# Patient Record
Sex: Female | Born: 1935 | Race: Black or African American | Hispanic: No | Smoking: Never smoker
Health system: Southern US, Community
[De-identification: ages and names within clinical notes are randomized; demographics above are authoritative.]

## PROBLEM LIST (undated history)

## (undated) DIAGNOSIS — I10 Essential (primary) hypertension: Secondary | ICD-10-CM

## (undated) DIAGNOSIS — E119 Type 2 diabetes mellitus without complications: Secondary | ICD-10-CM

## (undated) DIAGNOSIS — E785 Hyperlipidemia, unspecified: Secondary | ICD-10-CM

## (undated) DIAGNOSIS — E039 Hypothyroidism, unspecified: Secondary | ICD-10-CM

---

## 2004-05-04 ENCOUNTER — Ambulatory Visit: Payer: Self-pay | Admitting: *Deleted

## 2006-06-08 ENCOUNTER — Ambulatory Visit: Payer: Self-pay | Admitting: Nurse Practitioner

## 2007-08-03 ENCOUNTER — Ambulatory Visit: Payer: Self-pay | Admitting: Family Medicine

## 2008-08-08 ENCOUNTER — Ambulatory Visit: Payer: Self-pay | Admitting: Family Medicine

## 2009-08-27 ENCOUNTER — Ambulatory Visit: Payer: Self-pay | Admitting: Family Medicine

## 2009-09-15 ENCOUNTER — Ambulatory Visit: Payer: Self-pay | Admitting: Family Medicine

## 2010-09-17 ENCOUNTER — Ambulatory Visit: Payer: Self-pay | Admitting: Family Medicine

## 2011-03-28 ENCOUNTER — Emergency Department: Payer: Self-pay | Admitting: Emergency Medicine

## 2011-12-29 ENCOUNTER — Ambulatory Visit: Payer: Self-pay | Admitting: Family Medicine

## 2013-03-27 ENCOUNTER — Ambulatory Visit: Payer: Self-pay | Admitting: Family Medicine

## 2013-05-03 ENCOUNTER — Ambulatory Visit: Payer: Self-pay | Admitting: Family Medicine

## 2013-05-21 ENCOUNTER — Ambulatory Visit: Payer: Self-pay | Admitting: Orthopedic Surgery

## 2013-06-14 ENCOUNTER — Ambulatory Visit: Payer: Self-pay | Admitting: Pain Medicine

## 2013-06-25 ENCOUNTER — Ambulatory Visit: Payer: Self-pay | Admitting: Pain Medicine

## 2013-07-05 ENCOUNTER — Ambulatory Visit: Payer: Self-pay | Admitting: Pain Medicine

## 2013-07-19 ENCOUNTER — Ambulatory Visit: Payer: Self-pay | Admitting: Pain Medicine

## 2013-08-06 ENCOUNTER — Ambulatory Visit: Payer: Self-pay | Admitting: Pain Medicine

## 2013-09-05 ENCOUNTER — Ambulatory Visit: Payer: Self-pay | Admitting: Pain Medicine

## 2013-09-11 ENCOUNTER — Ambulatory Visit: Payer: Self-pay | Admitting: Pain Medicine

## 2013-11-02 ENCOUNTER — Ambulatory Visit: Payer: Self-pay | Admitting: Pain Medicine

## 2014-03-06 ENCOUNTER — Ambulatory Visit: Payer: Self-pay | Admitting: Family Medicine

## 2014-06-10 ENCOUNTER — Ambulatory Visit: Payer: Self-pay | Admitting: Family Medicine

## 2016-06-17 ENCOUNTER — Other Ambulatory Visit: Payer: Self-pay | Admitting: Family Medicine

## 2016-06-17 DIAGNOSIS — Z1231 Encounter for screening mammogram for malignant neoplasm of breast: Secondary | ICD-10-CM

## 2016-07-20 ENCOUNTER — Ambulatory Visit
Admission: RE | Admit: 2016-07-20 | Discharge: 2016-07-20 | Disposition: A | Discharge status: Home / Self Care | Payer: Medicare Other | Source: Ambulatory Visit | Attending: Family Medicine | Admitting: Family Medicine

## 2016-07-20 ENCOUNTER — Encounter: Payer: Self-pay | Admitting: Radiology

## 2016-07-20 DIAGNOSIS — Z1231 Encounter for screening mammogram for malignant neoplasm of breast: Secondary | ICD-10-CM | POA: Insufficient documentation

## 2017-04-29 ENCOUNTER — Emergency Department
Admission: EM | Admit: 2017-04-29 | Discharge: 2017-04-29 | Disposition: A | Discharge status: Home / Self Care | Payer: Medicare Other | Attending: Emergency Medicine | Admitting: Emergency Medicine

## 2017-04-29 ENCOUNTER — Emergency Department: Payer: Medicare Other

## 2017-04-29 DIAGNOSIS — J101 Influenza due to other identified influenza virus with other respiratory manifestations: Secondary | ICD-10-CM | POA: Diagnosis not present

## 2017-04-29 DIAGNOSIS — Z7984 Long term (current) use of oral hypoglycemic drugs: Secondary | ICD-10-CM | POA: Diagnosis not present

## 2017-04-29 DIAGNOSIS — R531 Weakness: Secondary | ICD-10-CM

## 2017-04-29 DIAGNOSIS — E876 Hypokalemia: Secondary | ICD-10-CM | POA: Insufficient documentation

## 2017-04-29 DIAGNOSIS — Z79899 Other long term (current) drug therapy: Secondary | ICD-10-CM | POA: Diagnosis not present

## 2017-04-29 LAB — BASIC METABOLIC PANEL
Anion gap: 13 (ref 5–15)
BUN: 28 mg/dL — AB (ref 6–20)
CHLORIDE: 102 mmol/L (ref 101–111)
CO2: 23 mmol/L (ref 22–32)
CREATININE: 1.55 mg/dL — AB (ref 0.44–1.00)
Calcium: 8.8 mg/dL — ABNORMAL LOW (ref 8.9–10.3)
GFR calc Af Amer: 35 mL/min — ABNORMAL LOW (ref >60)
GFR calc non Af Amer: 30 mL/min — ABNORMAL LOW (ref >60)
GLUCOSE: 167 mg/dL — AB (ref 65–99)
POTASSIUM: 3 mmol/L — AB (ref 3.5–5.1)
SODIUM: 138 mmol/L (ref 135–145)

## 2017-04-29 LAB — URINALYSIS, COMPLETE (UACMP) WITH MICROSCOPIC
BACTERIA UA: NONE SEEN
BILIRUBIN URINE: NEGATIVE
Glucose, UA: NEGATIVE mg/dL
KETONES UR: NEGATIVE mg/dL
Leukocytes, UA: NEGATIVE
Nitrite: NEGATIVE
PH: 5 (ref 5.0–8.0)
PROTEIN: NEGATIVE mg/dL
Specific Gravity, Urine: 1.011 (ref 1.005–1.030)

## 2017-04-29 LAB — CBC WITH DIFFERENTIAL/PLATELET
Basophils Absolute: 0 10*3/uL (ref 0–0.1)
Basophils Relative: 1 %
EOS ABS: 0.1 10*3/uL (ref 0–0.7)
Eosinophils Relative: 2 %
HEMATOCRIT: 40.4 % (ref 35.0–47.0)
Hemoglobin: 13.3 g/dL (ref 12.0–16.0)
LYMPHS ABS: 1.4 10*3/uL (ref 1.0–3.6)
LYMPHS PCT: 32 %
MCH: 29.7 pg (ref 26.0–34.0)
MCHC: 33 g/dL (ref 32.0–36.0)
MCV: 90.1 fL (ref 80.0–100.0)
MONOS PCT: 10 %
Monocytes Absolute: 0.4 10*3/uL (ref 0.2–0.9)
NEUTROS PCT: 55 %
Neutro Abs: 2.4 10*3/uL (ref 1.4–6.5)
Platelets: 197 10*3/uL (ref 150–440)
RBC: 4.49 MIL/uL (ref 3.80–5.20)
RDW: 13.9 % (ref 11.5–14.5)
WBC: 4.4 10*3/uL (ref 3.6–11.0)

## 2017-04-29 LAB — INFLUENZA PANEL BY PCR (TYPE A & B)
INFLBPCR: NEGATIVE
Influenza A By PCR: POSITIVE — AB

## 2017-04-29 LAB — TROPONIN I: Troponin I: <0.03 ng/mL (ref <0.03)

## 2017-04-29 MED ORDER — SODIUM CHLORIDE 0.9 % IV BOLUS (SEPSIS)
500.0000 mL | Freq: Once | INTRAVENOUS | Status: AC
  Administered 2017-04-29: 500 mL via INTRAVENOUS

## 2017-04-29 MED ORDER — POTASSIUM CHLORIDE ER 10 MEQ PO TBCR: 10.0000 meq | EXTENDED_RELEASE_TABLET | Freq: Two times a day (BID) | ORAL | 0 refills | Status: DC

## 2017-04-29 MED ORDER — POTASSIUM CHLORIDE 10 MEQ/100ML IV SOLN
10.0000 meq | Freq: Once | INTRAVENOUS | Status: AC
  Administered 2017-04-29: 10 meq via INTRAVENOUS
  Filled 2017-04-29: qty 100

## 2017-04-29 NOTE — Discharge Instructions (Addendum)
Your tests reveal the flu.  You also have a slightly low potassium.  You should try to get rest, stay well-hydrated, and eat regularly. You should follow-up with your doctor within 1-2 weeks and have your blood work rechecked.  Return to the emergency department immediately for new or worsening weakness, shortness of breath, high fevers, vomiting, or any other new or worsening symptoms that concern you.

## 2017-04-29 NOTE — ED Notes (Addendum)
This Rn called Lab to check on troponin results. Lamonte in Lab said it was still pending. RN will continue to monitor.

## 2017-04-29 NOTE — ED Notes (Signed)
Urine and Flu sent at this time.

## 2017-04-29 NOTE — ED Triage Notes (Signed)
Pt arrives via [facility] for weakness. Pt has had a cold for the last 2 wks and has generalized weakness now. Pt BP in transit was 80/69 500 mL given now BP 102/70. Pt is A/Ox4 NAD. Respiration are even unlabored and skin is warm and dry.Awaiting EDP

## 2017-04-29 NOTE — ED Notes (Signed)
Called lab, per lab they did not receive specimen so it has not been rubbed. Per lab they will process now

## 2017-04-29 NOTE — ED Notes (Signed)
Patient transported to CT 

## 2017-04-29 NOTE — ED Provider Notes (Addendum)
[facility] Emergency Department Provider Note ____________________________________________   First MD Initiated Contact with Patient 04/29/17 1223     (approximate)  I have reviewed the triage vital signs and the nursing notes.   HISTORY  Chief Complaint Weakness    HPI Allison Rogers is a 83 y.o. female with past medical history as noted below who presents with generalized weakness over the last several days, gradual onset, persistent course, and preceded by several days of nonproductive cough and nasal congestion and rhinorrhea which began about 1 week ago.  The patient denies chest pain, difficulty breathing, urinary symptoms, vomiting, or diarrhea.  There are no active problems to display for this patient.   No past surgical history on file.  Prior to Admission medications   Medication Sig Start Date End Date Taking? Authorizing Provider  alendronate (FOSAMAX) 70 MG tablet Take 70 mg by mouth once a week. 04/18/17   [provider]  amLODipine (NORVASC) 10 MG tablet Take 10 mg by mouth daily. 03/15/17   [provider]  atorvastatin (LIPITOR) 40 MG tablet Take 40 mg by mouth daily. 03/15/17   [provider]  brimonidine (ALPHAGAN) 0.2 % ophthalmic solution Place 1 drop into both eyes 2 (two) times daily. 03/15/17   [provider]  carvedilol (COREG) 6.25 MG tablet Take 6.25 mg by mouth 2 (two) times daily. 04/19/17   [provider]  cloNIDine (CATAPRES) 0.1 MG tablet Take 0.1 mg by mouth 2 (two) times daily. 04/18/17   [provider]  furosemide (LASIX) 20 MG tablet Take 20 mg by mouth daily. 04/19/17   [provider]  gabapentin (NEURONTIN) 100 MG capsule Take 100 mg by mouth 3 (three) times daily. 03/15/17   [provider]  GLIPIZIDE XL 10 MG 24 hr tablet Take 10 mg by mouth 2 (two) times daily. 03/24/17   [provider]  IBU 800 MG tablet Take 800 mg by mouth 3 (three)  times daily. 04/22/17   [provider]  lisinopril (PRINIVIL,ZESTRIL) 40 MG tablet Take 40 mg by mouth daily. 02/10/17   [provider]  meloxicam (MOBIC) 15 MG tablet Take 7.5 mg by mouth daily. 04/18/17   [provider]  metFORMIN (GLUCOPHAGE) 500 MG tablet Take 1 tablet by mouth 2 (two) times daily. 03/24/17   [provider]  omeprazole (PRILOSEC) 20 MG capsule Take 20 mg by mouth daily. 03/15/17   [provider]  potassium chloride (K-DUR) 10 MEQ tablet Take 1 tablet (10 mEq total) by mouth 2 (two) times daily for 7 days. 04/29/17 05/06/17  Dionne Bucy, MD  tiZANidine (ZANAFLEX) 4 MG tablet Take 4 mg by mouth 2 (two) times daily as needed. 04/18/17   [provider]    Allergies Patient has no allergy information on record.  Family History  Problem Relation Age of Onset  . Breast cancer Neg Hx     Social History Social History   Tobacco Use  . Smoking status: Not on file  Substance Use Topics  . Alcohol use: Not on file  . Drug use: Not on file    Review of Systems  Constitutional: Positive for chills and generalized weakness. Eyes: No redness. ENT: No sore throat. Cardiovascular: Denies chest pain. Respiratory: Denies shortness of breath. Gastrointestinal: No nausea, no vomiting.  No diarrhea.  Genitourinary: Negative for dysuria.  Musculoskeletal: Negative for back pain. Skin: Negative for rash. Neurological: Negative for headache.   ____________________________________________   PHYSICAL EXAM:  VITAL SIGNS: ED Triage Vitals  Enc Vitals Group     BP 04/29/17 1201 119/61     Pulse --      Resp 04/29/17 1201 16     Temp 04/29/17 1201 98.2 F (36.8 C)     Temp Source 04/29/17 1201 Oral     SpO2 04/29/17 1200 98 %     Weight 04/29/17 1201 200 lb (90.7 kg)     Height 04/29/17 1201 5\' 7"  (1.702 m)     Head Circumference --      Peak Flow --      Pain Score --      Pain Loc --      Pain Edu? --       Excl. in GC? --     Constitutional: Alert and oriented. Relatively well appearing and in no acute distress. Eyes: Conjunctivae are normal.  Head: Atraumatic. Nose: No congestion/rhinnorhea. Mouth/Throat: Mucous membranes are slightly dry.   Neck: Normal range of motion.  Cardiovascular: Normal rate, regular rhythm. Grossly normal heart sounds.  Good peripheral circulation. Respiratory: Normal respiratory effort.  No retractions. Lungs CTAB. Gastrointestinal: Soft and nontender. No distention.  Genitourinary: No CVA tenderness. Musculoskeletal: No lower extremity edema.  Extremities warm and well perfused.  Neurologic:  Normal speech and language. No gross focal neurologic deficits are appreciated.  Skin:  Skin is warm and dry. No rash noted. Psychiatric: Mood and affect are normal. Speech and behavior are normal.  ____________________________________________   LABS (all labs ordered are listed, but only abnormal results are displayed)  Labs Reviewed  BASIC METABOLIC PANEL - Abnormal; Notable for the following components:      Result Value   Potassium 3.0 (*)    Glucose, Bld 167 (*)    BUN 28 (*)    Creatinine, Ser 1.55 (*)    Calcium 8.8 (*)    GFR calc non Af Amer 30 (*)    GFR calc Af Amer 35 (*)    All other components within normal limits  URINALYSIS, COMPLETE (UACMP) WITH MICROSCOPIC - Abnormal; Notable for the following components:   Color, Urine YELLOW (*)    APPearance HAZY (*)    Hgb urine dipstick SMALL (*)    Squamous Epithelial / LPF 0-5 (*)    All other components within normal limits  INFLUENZA PANEL BY PCR (TYPE A & B) - Abnormal; Notable for the following components:   Influenza A By PCR POSITIVE (*)    All other components within normal limits  CBC WITH DIFFERENTIAL/PLATELET  TROPONIN I  TROPONIN I   ____________________________________________  EKG  ED ECG REPORT I, Dionne Bucy, the attending physician, personally viewed and interpreted  this ECG.  Date: 04/29/2017 EKG Time: 1204 Rate: 49 Rhythm: normal sinus rhythm QRS Axis: normal Intervals: normal ST/T Wave abnormalities: LVH, nonspecific abnormalities inferiorly Narrative Interpretation: no evidence of acute ischemia; no recent prior EKG available for comparison  ____________________________________________  RADIOLOGY  CXR: Probable pulmonary hypertension but no focal infiltrate  ____________________________________________   PROCEDURES  Procedure(s) performed: No    Critical Care performed: No ____________________________________________   INITIAL IMPRESSION / ASSESSMENT AND PLAN / ED COURSE  Pertinent labs & imaging results that were available during my care of the patient were reviewed by me and considered in my medical decision making (see chart for details).   83 year old female with past medical history as noted above presents with generalized weakness over the last several days, preceded by at least 1 week of  cough, nasal congestion and URI symptoms.  Past medical records reviewed in Epic and are noncontributory.  On exam, the patient is relatively well-appearing, the vital signs are normal, and the remainder the exam is unremarkable except for slightly dry mucous membranes.  Differential includes viral syndrome, influenza, pneumonia, UTI, or less likely other source of infection, dehydration or other metabolic cause, or less likely cardiac etiology.  Plan for infection workup, IV fluid bolus, basic labs, troponin x1, and reassess.  ----------------------------------------- 3:24 PM on 04/29/2017 -----------------------------------------  Patient's workup reveals influenza A as well as mild hypokalemia.  Since patient's symptoms have lasted for at least 1 week she has not a candidate for Tamiflu.  The remainder of the workup is unremarkable so anticipate that the patient will likely be able to be discharged.  However there has been a delay  with the troponin which was sent but there appears to be some type of clerical error with the lab.  We will obtain a troponin result, and replete K in the meantime.  If negative, anticipate discharge home.    ----------------------------------------- 4:03 PM on 04/29/2017 -----------------------------------------  Troponin is resulted and is negative.  The patient feels well to go home.  I counseled her on the results of the workup.  Patient states she is already on potassium repletion so I will not give a prescription at this time.  I instructed her to have her doctor check it in 1-2 weeks.  I gave thorough return precautions, and the patient expressed understanding.  ____________________________________________   FINAL CLINICAL IMPRESSION(S) / ED DIAGNOSES  Final diagnoses:  Influenza A  Weakness  Hypokalemia      NEW MEDICATIONS STARTED DURING THIS VISIT:  New Prescriptions   POTASSIUM CHLORIDE (K-DUR) 10 MEQ TABLET    Take 1 tablet (10 mEq total) by mouth 2 (two) times daily for 7 days.     Note:  This document was prepared using Dragon voice recognition software and may include unintentional dictation errors.   Dionne Bucy, MD 04/29/17 1526  Dionne Bucy, MD 04/29/17 [phone]

## 2017-07-13 ENCOUNTER — Other Ambulatory Visit: Payer: Self-pay | Admitting: Family Medicine

## 2017-07-13 DIAGNOSIS — Z1231 Encounter for screening mammogram for malignant neoplasm of breast: Secondary | ICD-10-CM

## 2017-08-16 ENCOUNTER — Ambulatory Visit
Admission: RE | Admit: 2017-08-16 | Discharge: 2017-08-16 | Disposition: A | Discharge status: Home / Self Care | Payer: Medicare Other | Source: Ambulatory Visit | Attending: Family Medicine | Admitting: Family Medicine

## 2017-08-16 DIAGNOSIS — Z1231 Encounter for screening mammogram for malignant neoplasm of breast: Secondary | ICD-10-CM | POA: Diagnosis not present

## 2017-08-16 DIAGNOSIS — R928 Other abnormal and inconclusive findings on diagnostic imaging of breast: Secondary | ICD-10-CM | POA: Diagnosis not present

## 2017-08-18 ENCOUNTER — Other Ambulatory Visit: Payer: Self-pay | Admitting: Family Medicine

## 2017-08-18 DIAGNOSIS — N6489 Other specified disorders of breast: Secondary | ICD-10-CM

## 2017-08-18 DIAGNOSIS — R928 Other abnormal and inconclusive findings on diagnostic imaging of breast: Secondary | ICD-10-CM

## 2017-09-06 ENCOUNTER — Ambulatory Visit
Admission: RE | Admit: 2017-09-06 | Discharge: 2017-09-06 | Disposition: A | Discharge status: Home / Self Care | Payer: Medicare Other | Source: Ambulatory Visit | Attending: Family Medicine | Admitting: Family Medicine

## 2017-09-06 DIAGNOSIS — N6489 Other specified disorders of breast: Secondary | ICD-10-CM

## 2017-09-06 DIAGNOSIS — R928 Other abnormal and inconclusive findings on diagnostic imaging of breast: Secondary | ICD-10-CM

## 2018-08-24 ENCOUNTER — Other Ambulatory Visit: Payer: Self-pay

## 2018-08-24 ENCOUNTER — Other Ambulatory Visit: Payer: Self-pay | Admitting: Family Medicine

## 2018-08-24 ENCOUNTER — Ambulatory Visit
Admission: RE | Admit: 2018-08-24 | Discharge: 2018-08-24 | Disposition: A | Discharge status: Home / Self Care | Payer: Medicare Other | Attending: Family Medicine | Admitting: Family Medicine

## 2018-08-24 ENCOUNTER — Ambulatory Visit
Admission: RE | Admit: 2018-08-24 | Discharge: 2018-08-24 | Disposition: A | Discharge status: Home / Self Care | Payer: Medicare Other | Source: Ambulatory Visit | Attending: Family Medicine | Admitting: Family Medicine

## 2018-08-24 DIAGNOSIS — M25862 Other specified joint disorders, left knee: Secondary | ICD-10-CM | POA: Diagnosis present

## 2018-08-30 ENCOUNTER — Other Ambulatory Visit: Payer: Self-pay | Admitting: Family Medicine

## 2018-08-30 DIAGNOSIS — M25862 Other specified joint disorders, left knee: Secondary | ICD-10-CM

## 2018-11-14 DIAGNOSIS — H401133 Primary open-angle glaucoma, bilateral, severe stage: Secondary | ICD-10-CM | POA: Insufficient documentation

## 2018-11-14 DIAGNOSIS — Z961 Presence of intraocular lens: Secondary | ICD-10-CM | POA: Insufficient documentation

## 2018-11-28 ENCOUNTER — Other Ambulatory Visit: Payer: Self-pay | Admitting: Neurology

## 2018-11-28 DIAGNOSIS — R2689 Other abnormalities of gait and mobility: Secondary | ICD-10-CM

## 2018-12-03 ENCOUNTER — Other Ambulatory Visit: Payer: Self-pay

## 2018-12-03 ENCOUNTER — Ambulatory Visit
Admission: RE | Admit: 2018-12-03 | Discharge: 2018-12-03 | Disposition: A | Discharge status: Home / Self Care | Payer: Medicare Other | Source: Ambulatory Visit | Attending: Family Medicine | Admitting: Family Medicine

## 2018-12-03 ENCOUNTER — Ambulatory Visit
Admission: RE | Admit: 2018-12-03 | Discharge: 2018-12-03 | Disposition: A | Discharge status: Home / Self Care | Payer: Medicare Other | Source: Ambulatory Visit | Attending: Neurology | Admitting: Neurology

## 2018-12-03 DIAGNOSIS — R2689 Other abnormalities of gait and mobility: Secondary | ICD-10-CM

## 2018-12-03 DIAGNOSIS — M25862 Other specified joint disorders, left knee: Secondary | ICD-10-CM | POA: Diagnosis not present

## 2018-12-03 LAB — POCT I-STAT CREATININE: Creatinine, Ser: 1 mg/dL (ref 0.44–1.00)

## 2018-12-03 MED ORDER — GADOBUTROL 1 MMOL/ML IV SOLN
9.0000 mL | Freq: Once | INTRAVENOUS | Status: AC | PRN
Start: 2018-12-03 — End: 2018-12-03
  Administered 2018-12-03: 9 mL via INTRAVENOUS

## 2018-12-11 ENCOUNTER — Ambulatory Visit: Payer: Medicare Other

## 2019-08-28 ENCOUNTER — Other Ambulatory Visit: Payer: Self-pay | Admitting: Family Medicine

## 2019-08-28 DIAGNOSIS — Z1231 Encounter for screening mammogram for malignant neoplasm of breast: Secondary | ICD-10-CM

## 2019-08-31 ENCOUNTER — Ambulatory Visit
Admission: RE | Admit: 2019-08-31 | Discharge: 2019-08-31 | Disposition: A | Discharge status: Home / Self Care | Payer: Medicare Other | Source: Ambulatory Visit | Attending: Family Medicine | Admitting: Family Medicine

## 2019-08-31 DIAGNOSIS — Z1231 Encounter for screening mammogram for malignant neoplasm of breast: Secondary | ICD-10-CM

## 2020-03-31 ENCOUNTER — Other Ambulatory Visit: Payer: Self-pay | Admitting: Family Medicine

## 2020-03-31 DIAGNOSIS — Z13820 Encounter for screening for osteoporosis: Secondary | ICD-10-CM

## 2020-03-31 DIAGNOSIS — Z1231 Encounter for screening mammogram for malignant neoplasm of breast: Secondary | ICD-10-CM

## 2020-09-01 ENCOUNTER — Emergency Department: Payer: Medicare Other

## 2020-09-01 ENCOUNTER — Inpatient Hospital Stay
Admission: EM | Admit: 2020-09-01 | Discharge: 2020-09-04 | DRG: 640 | Disposition: A | Discharge status: Home Health | Payer: Medicare Other | Attending: Internal Medicine | Admitting: Internal Medicine

## 2020-09-01 DIAGNOSIS — I10 Essential (primary) hypertension: Secondary | ICD-10-CM | POA: Diagnosis present

## 2020-09-01 DIAGNOSIS — Z79899 Other long term (current) drug therapy: Secondary | ICD-10-CM | POA: Diagnosis not present

## 2020-09-01 DIAGNOSIS — R5381 Other malaise: Secondary | ICD-10-CM

## 2020-09-01 DIAGNOSIS — R627 Adult failure to thrive: Secondary | ICD-10-CM | POA: Diagnosis present

## 2020-09-01 DIAGNOSIS — E785 Hyperlipidemia, unspecified: Secondary | ICD-10-CM | POA: Diagnosis present

## 2020-09-01 DIAGNOSIS — E8809 Other disorders of plasma-protein metabolism, not elsewhere classified: Secondary | ICD-10-CM | POA: Diagnosis present

## 2020-09-01 DIAGNOSIS — I440 Atrioventricular block, first degree: Secondary | ICD-10-CM | POA: Diagnosis present

## 2020-09-01 DIAGNOSIS — I160 Hypertensive urgency: Secondary | ICD-10-CM | POA: Diagnosis not present

## 2020-09-01 DIAGNOSIS — Z7984 Long term (current) use of oral hypoglycemic drugs: Secondary | ICD-10-CM

## 2020-09-01 DIAGNOSIS — I959 Hypotension, unspecified: Secondary | ICD-10-CM | POA: Diagnosis not present

## 2020-09-01 DIAGNOSIS — R531 Weakness: Secondary | ICD-10-CM | POA: Diagnosis not present

## 2020-09-01 DIAGNOSIS — Z79890 Hormone replacement therapy (postmenopausal): Secondary | ICD-10-CM

## 2020-09-01 DIAGNOSIS — R571 Hypovolemic shock: Secondary | ICD-10-CM

## 2020-09-01 DIAGNOSIS — E876 Hypokalemia: Secondary | ICD-10-CM | POA: Diagnosis not present

## 2020-09-01 DIAGNOSIS — E039 Hypothyroidism, unspecified: Secondary | ICD-10-CM

## 2020-09-01 DIAGNOSIS — I428 Other cardiomyopathies: Secondary | ICD-10-CM | POA: Diagnosis not present

## 2020-09-01 DIAGNOSIS — Z7189 Other specified counseling: Secondary | ICD-10-CM | POA: Diagnosis not present

## 2020-09-01 DIAGNOSIS — R55 Syncope and collapse: Secondary | ICD-10-CM | POA: Diagnosis present

## 2020-09-01 DIAGNOSIS — F32A Depression, unspecified: Secondary | ICD-10-CM | POA: Diagnosis present

## 2020-09-01 DIAGNOSIS — Z20822 Contact with and (suspected) exposure to covid-19: Secondary | ICD-10-CM | POA: Diagnosis present

## 2020-09-01 DIAGNOSIS — N179 Acute kidney failure, unspecified: Secondary | ICD-10-CM | POA: Diagnosis present

## 2020-09-01 DIAGNOSIS — E119 Type 2 diabetes mellitus without complications: Secondary | ICD-10-CM | POA: Diagnosis present

## 2020-09-01 DIAGNOSIS — E872 Acidosis: Secondary | ICD-10-CM | POA: Diagnosis present

## 2020-09-01 DIAGNOSIS — R001 Bradycardia, unspecified: Secondary | ICD-10-CM

## 2020-09-01 DIAGNOSIS — Z7983 Long term (current) use of bisphosphonates: Secondary | ICD-10-CM | POA: Diagnosis not present

## 2020-09-01 DIAGNOSIS — Z880 Allergy status to penicillin: Secondary | ICD-10-CM | POA: Diagnosis not present

## 2020-09-01 DIAGNOSIS — Z7982 Long term (current) use of aspirin: Secondary | ICD-10-CM

## 2020-09-01 DIAGNOSIS — Z6828 Body mass index (BMI) 28.0-28.9, adult: Secondary | ICD-10-CM

## 2020-09-01 DIAGNOSIS — Z791 Long term (current) use of non-steroidal anti-inflammatories (NSAID): Secondary | ICD-10-CM | POA: Diagnosis not present

## 2020-09-01 DIAGNOSIS — E860 Dehydration: Secondary | ICD-10-CM | POA: Diagnosis not present

## 2020-09-01 DIAGNOSIS — Z515 Encounter for palliative care: Secondary | ICD-10-CM | POA: Diagnosis not present

## 2020-09-01 HISTORY — DX: Hyperlipidemia, unspecified: E78.5

## 2020-09-01 HISTORY — DX: Essential (primary) hypertension: I10

## 2020-09-01 HISTORY — DX: Hypothyroidism, unspecified: E03.9

## 2020-09-01 HISTORY — DX: Type 2 diabetes mellitus without complications: E11.9

## 2020-09-01 LAB — CBC WITH DIFFERENTIAL/PLATELET
Abs Immature Granulocytes: 0.08 10*3/uL — ABNORMAL HIGH (ref 0.00–0.07)
Basophils Absolute: 0.1 10*3/uL (ref 0.0–0.1)
Basophils Relative: 1 %
Eosinophils Absolute: 0.1 10*3/uL (ref 0.0–0.5)
Eosinophils Relative: 1 %
HCT: 32.1 % — ABNORMAL LOW (ref 36.0–46.0)
Hemoglobin: 10 g/dL — ABNORMAL LOW (ref 12.0–15.0)
Immature Granulocytes: 1 %
Lymphocytes Relative: 17 %
Lymphs Abs: 1.3 10*3/uL (ref 0.7–4.0)
MCH: 28.3 pg (ref 26.0–34.0)
MCHC: 31.2 g/dL (ref 30.0–36.0)
MCV: 90.9 fL (ref 80.0–100.0)
Monocytes Absolute: 0.5 10*3/uL (ref 0.1–1.0)
Monocytes Relative: 7 %
Neutro Abs: 5.6 10*3/uL (ref 1.7–7.7)
Neutrophils Relative %: 73 %
Platelets: 375 10*3/uL (ref 150–400)
RBC: 3.53 MIL/uL — ABNORMAL LOW (ref 3.87–5.11)
RDW: 13.4 % (ref 11.5–15.5)
WBC: 7.6 10*3/uL (ref 4.0–10.5)
nRBC: 0 % (ref 0.0–0.2)

## 2020-09-01 LAB — COMPREHENSIVE METABOLIC PANEL
ALT: 25 U/L (ref 0–44)
AST: 31 U/L (ref 15–41)
Albumin: 3 g/dL — ABNORMAL LOW (ref 3.5–5.0)
Alkaline Phosphatase: 58 U/L (ref 38–126)
Anion gap: 13 (ref 5–15)
BUN: 41 mg/dL — ABNORMAL HIGH (ref 8–23)
CO2: 25 mmol/L (ref 22–32)
Calcium: 8.6 mg/dL — ABNORMAL LOW (ref 8.9–10.3)
Chloride: 97 mmol/L — ABNORMAL LOW (ref 98–111)
Creatinine, Ser: 2.04 mg/dL — ABNORMAL HIGH (ref 0.44–1.00)
GFR, Estimated: 24 mL/min — ABNORMAL LOW (ref >60)
Glucose, Bld: 187 mg/dL — ABNORMAL HIGH (ref 70–99)
Potassium: 2.8 mmol/L — ABNORMAL LOW (ref 3.5–5.1)
Sodium: 135 mmol/L (ref 135–145)
Total Bilirubin: 0.7 mg/dL (ref 0.3–1.2)
Total Protein: 7.3 g/dL (ref 6.5–8.1)

## 2020-09-01 LAB — LACTIC ACID, PLASMA
Lactic Acid, Venous: 2.5 mmol/L (ref 0.5–1.9)
Lactic Acid, Venous: 2.5 mmol/L (ref 0.5–1.9)

## 2020-09-01 LAB — RESP PANEL BY RT-PCR (FLU A&B, COVID) ARPGX2
Influenza A by PCR: NEGATIVE
Influenza B by PCR: NEGATIVE
SARS Coronavirus 2 by RT PCR: NEGATIVE

## 2020-09-01 LAB — TROPONIN I (HIGH SENSITIVITY)
Troponin I (High Sensitivity): 19 ng/L — ABNORMAL HIGH (ref <18)
Troponin I (High Sensitivity): 17 ng/L (ref <18)

## 2020-09-01 LAB — MAGNESIUM: Magnesium: 2.4 mg/dL (ref 1.7–2.4)

## 2020-09-01 LAB — PROCALCITONIN: Procalcitonin: <0.1 ng/mL

## 2020-09-01 MED ORDER — HEPARIN SODIUM (PORCINE) 5000 UNIT/ML IJ SOLN
5000.0000 [IU] | Freq: Three times a day (TID) | INTRAMUSCULAR | Status: DC
  Administered 2020-09-02 – 2020-09-04 (×7): 5000 [IU] via SUBCUTANEOUS
  Filled 2020-09-01 (×7): qty 1

## 2020-09-01 MED ORDER — NOREPINEPHRINE 4 MG/250ML-% IV SOLN: 2.0000 ug/min | INTRAVENOUS | Status: DC

## 2020-09-01 MED ORDER — LACTATED RINGERS IV SOLN: INTRAVENOUS | Status: AC

## 2020-09-01 MED ORDER — POTASSIUM CHLORIDE CRYS ER 20 MEQ PO TBCR
40.0000 meq | EXTENDED_RELEASE_TABLET | Freq: Once | ORAL | Status: AC
  Administered 2020-09-01: 40 meq via ORAL
  Filled 2020-09-01: qty 2

## 2020-09-01 MED ORDER — POLYETHYLENE GLYCOL 3350 17 G PO PACK: 17.0000 g | PACK | Freq: Every day | ORAL | Status: DC | PRN

## 2020-09-01 MED ORDER — SODIUM CHLORIDE 0.9 % IV SOLN: 250.0000 mL | INTRAVENOUS | Status: DC

## 2020-09-01 MED ORDER — DOCUSATE SODIUM 100 MG PO CAPS: 100.0000 mg | ORAL_CAPSULE | Freq: Two times a day (BID) | ORAL | Status: DC | PRN

## 2020-09-01 MED ORDER — LACTATED RINGERS IV BOLUS
1000.0000 mL | Freq: Once | INTRAVENOUS | Status: AC
  Administered 2020-09-01: 1000 mL via INTRAVENOUS

## 2020-09-01 MED ORDER — MAGNESIUM SULFATE 2 GM/50ML IV SOLN
2.0000 g | Freq: Once | INTRAVENOUS | Status: AC
  Administered 2020-09-01: 2 g via INTRAVENOUS
  Filled 2020-09-01: qty 50

## 2020-09-01 MED ORDER — ATROPINE SULFATE 1 MG/10ML IJ SOSY
0.5000 mg | PREFILLED_SYRINGE | Freq: Once | INTRAMUSCULAR | Status: AC
  Administered 2020-09-01: 0.5 mg via INTRAVENOUS
  Filled 2020-09-01: qty 10

## 2020-09-01 NOTE — ED Provider Notes (Signed)
[facility] Emergency Department Provider Note   ____________________________________________   Event Date/Time   First MD Initiated Contact with Patient 09/01/20 2029     (approximate)  I have reviewed the triage vital signs and the nursing notes.   HISTORY  Chief Complaint Weakness and Altered Mental Status    HPI Allison Rogers is a 86 y.o. female with past medical history of hypertension, hyperlipidemia, diabetes who presents to the ED for altered mental status.   EMS states that they were called by family for patient being lethargic and having trouble standing.  They noticed that patient seemed to have a fixed gaze off to the right for about 15 seconds, but she did not have any stiffness or generalized seizure activity.  They found her to be bradycardic in the 40s and she was given 0.5 mg of atropine with slight improvement in her heart rate.  Patient responds to questions on arrival, states that she has been feeling weak for a few days and is "just tired."  She denies any fevers, cough, chest pain, shortness of breath, abdominal pain, nausea, vomiting, dysuria, or hematuria.  Daughter at bedside states that patient has been depressed and withdrawn since the death of her daughter about a month and a half ago.  Daughter is concerned that patient has not been taking her medications appropriately and feels like she has been taking less than prescribed.  Patient denies this and denies any recent changes in her medication, does state that she has been taking tizanidine twice a day recently for pain in her knee.        No past medical history on file.  There are no problems to display for this patient.   No past surgical history on file.  Prior to Admission medications   Medication Sig Start Date End Date Taking? Authorizing Provider  alendronate (FOSAMAX) 70 MG tablet Take 70 mg by mouth once a week. 04/18/17   [provider]  amLODipine (NORVASC)  10 MG tablet Take 10 mg by mouth daily. 03/15/17   [provider]  atorvastatin (LIPITOR) 40 MG tablet Take 40 mg by mouth daily. 03/15/17   [provider]  brimonidine (ALPHAGAN) 0.2 % ophthalmic solution Place 1 drop into both eyes 2 (two) times daily. 03/15/17   [provider]  carvedilol (COREG) 6.25 MG tablet Take 6.25 mg by mouth 2 (two) times daily. 04/19/17   [provider]  cloNIDine (CATAPRES) 0.1 MG tablet Take 0.1 mg by mouth 2 (two) times daily. 04/18/17   [provider]  furosemide (LASIX) 20 MG tablet Take 20 mg by mouth daily. 04/19/17   [provider]  gabapentin (NEURONTIN) 100 MG capsule Take 100 mg by mouth 3 (three) times daily. 03/15/17   [provider]  GLIPIZIDE XL 10 MG 24 hr tablet Take 10 mg by mouth 2 (two) times daily. 03/24/17   [provider]  IBU 800 MG tablet Take 800 mg by mouth 3 (three) times daily. 04/22/17   [provider]  lisinopril (PRINIVIL,ZESTRIL) 40 MG tablet Take 40 mg by mouth daily. 02/10/17   [provider]  meloxicam (MOBIC) 15 MG tablet Take 7.5 mg by mouth daily. 04/18/17   [provider]  metFORMIN (GLUCOPHAGE) 500 MG tablet Take 1 tablet by mouth 2 (two) times daily. 03/24/17   [provider]  omeprazole (PRILOSEC) 20 MG capsule Take 20 mg by mouth daily. 03/15/17   [provider]  tiZANidine (  ZANAFLEX) 4 MG tablet Take 4 mg by mouth 2 (two) times daily as needed. 04/18/17   [provider]    Allergies Patient has no allergy information on record.  Family History  Problem Relation Age of Onset  . Breast cancer Neg Hx     Social History    Review of Systems  Constitutional: No fever/chills.  Positive for generalized weakness and fatigue. Eyes: No visual changes. ENT: No sore throat. Cardiovascular: Denies chest pain. Respiratory: Denies shortness of breath. Gastrointestinal: No abdominal pain.  No nausea, no  vomiting.  No diarrhea.  No constipation. Genitourinary: Negative for dysuria. Musculoskeletal: Negative for back pain. Skin: Negative for rash. Neurological: Negative for headaches, focal weakness or numbness.  ____________________________________________   PHYSICAL EXAM:  VITAL SIGNS: ED Triage Vitals  Enc Vitals Group     BP      Pulse      Resp      Temp      Temp src      SpO2      Weight      Height      Head Circumference      Peak Flow      Pain Score      Pain Loc      Pain Edu?      Excl. in GC?     Constitutional: Somnolent but arousable to voice, oriented to person, place, time, and situation. Eyes: Conjunctivae are normal.  Pupils equal, round, and reactive to light bilaterally. Head: Atraumatic. Nose: No congestion/rhinnorhea. Mouth/Throat: Mucous membranes are moist. Neck: Normal ROM Cardiovascular: Bradycardic, regular rhythm. Grossly normal heart sounds. Respiratory: Normal respiratory effort.  No retractions. Lungs CTAB. Gastrointestinal: Soft and nontender. No distention. Genitourinary: deferred Musculoskeletal: No lower extremity tenderness nor edema. Neurologic:  Normal speech and language.  Global weakness with no gross focal neurologic deficits appreciated. Skin:  Skin is warm, dry and intact. No rash noted. Psychiatric: Mood and affect are normal. Speech and behavior are normal.  ____________________________________________   LABS (all labs ordered are listed, but only abnormal results are displayed)  Labs Reviewed  CBC WITH DIFFERENTIAL/PLATELET - Abnormal; Notable for the following components:      Result Value   RBC 3.53 (*)    Hemoglobin 10.0 (*)    HCT 32.1 (*)    Abs Immature Granulocytes 0.08 (*)    All other components within normal limits  COMPREHENSIVE METABOLIC PANEL - Abnormal; Notable for the following components:   Potassium 2.8 (*)    Chloride 97 (*)    Glucose, Bld 187 (*)    BUN 41 (*)    Creatinine, Ser 2.04 (*)     Calcium 8.6 (*)    Albumin 3.0 (*)    GFR, Estimated 24 (*)    All other components within normal limits  LACTIC ACID, PLASMA - Abnormal; Notable for the following components:   Lactic Acid, Venous 2.5 (*)    All other components within normal limits  TROPONIN I (HIGH SENSITIVITY) - Abnormal; Notable for the following components:   Troponin I (High Sensitivity) 19 (*)    All other components within normal limits  RESP PANEL BY RT-PCR (FLU A&B, COVID) [id number]  CULTURE, BLOOD (ROUTINE X 2)  CULTURE, BLOOD (ROUTINE X 2)  PROCALCITONIN  MAGNESIUM  URINALYSIS, COMPLETE (UACMP) WITH MICROSCOPIC  LACTIC ACID, PLASMA  PROCALCITONIN  TROPONIN I (HIGH SENSITIVITY)   ____________________________________________  EKG  ED ECG REPORT I, Chesley Noon, the attending physician, personally  viewed and interpreted this ECG.   Date: 09/01/2020  EKG Time: 20:37  Rate: 57  Rhythm: sinus bradycardia  Axis: Normal  Intervals:nonspecific intraventricular conduction delay  ST&T Change: None   PROCEDURES  Procedure(s) performed (including Critical Care):  Procedures   ____________________________________________   INITIAL IMPRESSION / ASSESSMENT AND PLAN / ED COURSE       86 year old female with past medical history of hypertension, hyperlipidemia, and diabetes who presents to the ED for altered mental status and generalized weakness.  She was noted to be bradycardic prior to arrival by EMS, was given a dose of atropine with slight improvement in her heart rate, but remains hypotensive here in the ED.  Her heart rate is in the 50s with a sinus bradycardia, which seems unlikely to be the source of her hypotension.  She was given a another dose of atropine with minimal improvement in heart rate or blood pressure.  Daughter does state that she has been not eating or drinking much lately and patient does appear dehydrated, we will give 2 L of IV fluids and reassess her blood pressure.  We will  start work-up to rule out sepsis, ACS, intracranial process, AKI, or electrolyte abnormality.  Chest x-ray reviewed by me and shows no infiltrate, edema, or effusion, procalcitonin is undetectable and I have a low suspicion for sepsis at this time.  She does have a mild AKI with some hypokalemia, some dehydration could be contributing to her presentation given recent poor p.o. intake.  I would also be suspicious for medication effect as family is concerned patient has not been taking her medications recently.  She does report taking tizanidine regularly, which could cause bradycardia, hypotension, and respiratory depression.  Patient remains awake and alert with no respiratory depression, blood pressure gradually improving following IV fluid bolus.  We will need to reassess her blood pressure following completion of 2 L total IV fluids, if she remains hypotensive will benefit from starting on Levophed.  Patient turned over to oncoming provider pending reassessment of BP and admission.      ____________________________________________   FINAL CLINICAL IMPRESSION(S) / ED DIAGNOSES  Final diagnoses:  Hypotension, unspecified hypotension type  Bradycardia  Generalized weakness     ED Discharge Orders    None       Note:  This document was prepared using Dragon voice recognition software and may include unintentional dictation errors.   Chesley Noon, MD 09/01/20 2210

## 2020-09-01 NOTE — ED Notes (Signed)
Pt arrived back from CT

## 2020-09-01 NOTE — ED Notes (Signed)
Pt to CT scan.

## 2020-09-01 NOTE — ED Triage Notes (Signed)
Pt BIB EMS for altered mental status and weakness.   Pt family called it in.  EMS arrived on scene and and noticed pt to be lethargic and having trouble standing.  EMS noticed a fixed gaze.   Pt HR on scene found to be 40. 0.5 of atropine was given

## 2020-09-01 NOTE — H&P (Signed)
NAME:  Allison Rogers, MRN:  [id number], DOB:  03/04/1935, LOS: 1 ADMISSION DATE:  09/01/2020, CONSULTATION DATE: 09/01/2020 REFERRING MD: Dr. Vicente Males, CHIEF COMPLAINT: Weakness and altered mental status  History of Present Illness:  86 year old female presenting to the [facility] ED from home via EMS due to family concern that the patient was lethargic and having difficulty standing.  Niece also reported the patient had a period of fixed gaze off to the right for about 15 seconds.  This was described as without stiffness or generalized seizure activity.  EMS discovered the patient to be bradycardic in the 40s and administered 0.5 mg of atropine in the field with mild improvement. ED course: Upon arrival patient was responding to questions stating she has been feeling weak for a few days and is " just tired".  She denied fever, cough chest pain, shortness of breath, abdominal pain, nausea/vomiting, dysuria or hematuria.  Daughter at bedside states the patient has been depressed and withdrawn since the death of her daughter about a month and a half ago, she has also lost her husband within the last year.  There is a concern that maybe the patient is not taking medications appropriately.  Only recent change to medications is in addition of tizanidine twice daily for knee pain. Initial vitals: Tachypneic at 23, bradycardic at 54, severely hypotensive at 59/40 (47), SPO2 99% on room air. Significant labs: Hypokalemic 2.8, BUN/Cr- 41/2.04, albumin 3, troponin 19 > 17, lactic acidosis 2.5 > 2.5, PCT negative at < 0.10.  Daughter reports, with patient's confirmation, that the patient has not been eating or drinking enough " for a while".  Daughter also described, again with patient confirmation, that over the last year the patient has had intermittent episodes of brief loss of consciousness.  In discussions with the patient she admits knowing when these episodes have occurred and the episodes seem brief in description.   The patient denies any blurred vision/dizziness/visual disturbances.  These episodes have not been worked up previously.  And per patient/daughter report she has never had any need for a cardiology work-up.  PCCM consulted due to suspected need for vasopressor administration.  Pertinent  Medical History  Hypertension Hyperlipidemia Type 2 diabetes mellitus Hypothyroidism Significant Hospital Events: Including procedures, antibiotic start and stop dates in addition to other pertinent events   . 09/01/2020-admit to ICU for suspected vasopressor need  Interim History / Subjective:  Patient resting comfortably with daughter bedside. See HPI for additional details Objective   Blood pressure 114/61, pulse (!) 49, temperature 97.6 F (36.4 C), temperature source Oral, resp. rate 17, height 5\' 6"  (1.676 m), weight 72.6 kg, SpO2 100 %.        Intake/Output Summary (Last 24 hours) at 09/02/2020 0541 Last data filed at 09/01/2020 2301 Gross per 24 hour  Intake 3000 ml  Output --  Net 3000 ml   Filed Weights   09/01/20 2039 09/02/20 0240  Weight: 72.6 kg 72.6 kg    Examination: General: Adult female, acutely ill, lying in bed, NAD HEENT: MM pink/moist, anicteric, atraumatic, neck supple Neuro: A&O x 4, able to follow commands, PERRL +3, MAE, generalized weakness CV: s1s2, regular, bradycardic on monitor, no r/m/g Pulm: Regular, non labored on room air, breath sounds clear-BUL & diminished-BLL GI: soft, rounded, non tender, bs x 4 GU: Pure wick in place  Skin: limited exam- no rashes/lesions noted Extremities: warm/dry, pulses + 2 R/P, no edema noted  Labs/imaging that I have personally reviewed  (right click and "  Reselect all SmartList Selections" daily)  EKG Interpretation Date:  09/01/2020 EKG Time:  2037 Rate:  57 Rhythm: Sinus bradycardia QRS Axis:  Normal Intervals: Normal ST/T Wave abnormalities: Non specific T wave inversions Narrative Interpretation: Sinus Bradycardia  with non specific T wave inversion  Na+/ K+: 135/2.8 BUN/Cr.: 41/2.04 Serum CO2/ AG: 25/13  Hgb: 10 Troponin: 19 > 17  WBC/ TMAX: 7.6/ - Lactic/ PCT: 2.5 > 2.5/ <0.10  CXR 09/01/20: Low lung volumes with bronchovascular crowding without focal airspace disease CTh 09/01/2020: ?  Mild left parietal scalp swelling-no hematoma or calvarial fracture.  No acute intracranial abnormality.  Hypoattenuating foci in the bilateral basal ganglia likely reflects sequela of remote lacunar type infarcts versus perivascular spaces. Resolved Hospital Problem list     Assessment & Plan:  Hypovolemic shock in the setting of poor PO intake and dehydration Lactic acidosis -PCT negative, no leukocytosis, afebrile without signs of infection Patient has had poor p.o. intake " for a while".  Patient received 2 L of LR in ED -Lactic remains > 2 after IV fluid resuscitation, start continuous IVF for additional 3rd L of LR - trend lactic/ PCT - initiate peripheral vasopressors PRN to maintain MAP > 65 - daily CBC, monitor fever/WBC curve  Bradycardia PMHx: HTN, hypothyroidism - home regimen: coreg on hold d/t to bradycardia and hypotension on admission - continuous cardiac monitoring - f/u TSH - continue home synthroid  Suspected syncopal episodes in the setting of bradycardia & hypovolemia Patient and daughter describe intermittent episodes over the past year with the patient would have brief loss of consciousness.  Patient has not had a syncopal work-up previously.  Patient has also had the death of her daughter & husband this past year. DDx include Takotsubo's CM, Aortic Stenosis, symptomatic bradycardia, Orthostatic Hypotension, hypovolemia -Echocardiogram ordered - consult cardiology if Echocardiogram is abnormal - avoid any sedating medications including home medications: gabapentin, tizanidine  Acute Kidney Injury in the setting of hypovolemic shock Baseline Cr: Unclear (most recent 1.0 in  11/2018)-patient and daughter do not believe she has been diagnosed with CKD previously, Cr on admission: 2.04 - Strict I/O's: alert provider if UOP < 0.5 mL/kg/hr - gentle IVF hydration  - Daily BMP, replace electrolytes PRN - Avoid nephrotoxic agents as able, ensure adequate renal perfusion - Consult nephrology if iHD or CRRT indicated  - consider renal US  Type 2 diabetes mellitus -Every 4 CBG monitoring, add SSI coverage as needed -Target range 140-180 -Home regimen: Metformin & Tradjenta on hold -Follow ICU hypo-/hyper-glycemia protocol  Hyperlipidemia -Continue home atorvastatin 40 mg daily  Failure to Thrive Hypokalemia - 2.8 Hypoalbuminemia - 3.0 - Received K+ & Mg replacement - daily BMP, replace electrolytes as needed - regular diet as tolerated - Dietary consult to assist with nutrition needs  Best practice (right click and "Reselect all SmartList Selections" daily)  Diet:  Oral Pain/Anxiety/Delirium protocol (if indicated): No VAP protocol (if indicated): Not indicated DVT prophylaxis: Subcutaneous Heparin GI prophylaxis: N/A Glucose control:  SSI No Central venous access:  N/A Arterial line:  N/A Foley:  N/A Mobility:  bed rest  PT consulted: N/A Last date of multidisciplinary goals of care discussion 09/01/20 Code Status:  limited, DNI only- long discussion with patient and daughter bedside.  Patient does not want to be intubated/mechanically ventilated if her respiratory status were to deteriorate.  At this time she would still like to receive CPR/ACLS protocol in the setting of cardiac arrest Disposition: SDU  Labs   CBC: Recent  Labs  Lab 09/01/20 2046 09/02/20 0409  WBC 7.6 9.9  NEUTROABS 5.6  --   HGB 10.0* 9.9*  HCT 32.1* 30.7*  MCV 90.9 89.2  PLT 375 336    Basic Metabolic Panel: Recent Labs  Lab 08/31/20 2302 09/01/20 2046 09/02/20 0409  NA  --  135 135  K  --  2.8* 3.5  CL  --  97* 101  CO2  --  25 24  GLUCOSE  --  187* 117*  BUN   --  41* 38*  CREATININE  --  2.04* 1.81*  CALCIUM  --  8.6* 8.7*  MG  --  2.4 2.7*  PHOS 2.8  --  3.4   GFR: Estimated Creatinine Clearance: 23.6 mL/min (A) (by C-G formula based on SCr of 1.81 mg/dL (H)). Recent Labs  Lab 09/01/20 2046 09/01/20 2302 09/02/20 0409  PROCALCITON <0.10  --   --   WBC 7.6  --  9.9  LATICACIDVEN 2.5* 2.5* 1.9    Liver Function Tests: Recent Labs  Lab 09/01/20 2046  AST 31  ALT 25  ALKPHOS 58  BILITOT 0.7  PROT 7.3  ALBUMIN 3.0*   No results for input(s): LIPASE, AMYLASE in the last 168 hours. No results for input(s): AMMONIA in the last 168 hours.  ABG No results found for: PHART, PCO2ART, PO2ART, HCO3, TCO2, ACIDBASEDEF, O2SAT   Coagulation Profile: No results for input(s): INR, PROTIME in the last 168 hours.  Cardiac Enzymes: No results for input(s): CKTOTAL, CKMB, CKMBINDEX, TROPONINI in the last 168 hours.  HbA1C: No results found for: HGBA1C  CBG: Recent Labs  Lab 09/02/20 0230  GLUCAP 120*    Review of Systems: Positives in bold  Gen: Denies fever, chills, weight change, fatigue, night sweats HEENT: Denies blurred vision, double vision, hearing loss, tinnitus, sinus congestion, rhinorrhea, sore throat, neck stiffness, dysphagia PULM: Denies shortness of breath, cough, sputum production, hemoptysis, wheezing CV: Denies chest pain, edema, orthopnea, paroxysmal nocturnal dyspnea, palpitations GI: Denies abdominal pain, nausea, vomiting, diarrhea, hematochezia, melena, constipation, change in bowel habits GU: Denies dysuria, hematuria, polyuria, oliguria, urethral discharge Endocrine: Denies hot or cold intolerance, polyuria, polyphagia or appetite change Derm: Denies rash, dry skin, scaling or peeling skin change Heme: Denies easy bruising, bleeding, bleeding gums Neuro: Denies headache, numbness, weakness, slurred speech, loss of memory or consciousness  Past Medical History:  She,  has a past medical history of  Hyperlipidemia, Hypertension, Hypothyroidism, and Type 2 diabetes mellitus (HCC).   Surgical History:  No past surgical history on file.   Social History:      Family History:  Her family history is negative for Breast cancer.   Allergies Allergies  Allergen Reactions  . Penicillins Itching and Rash     Home Medications  Prior to Admission medications   Medication Sig Start Date End Date Taking? Authorizing Provider  aspirin 81 MG EC tablet Take 81 mg by mouth daily.   Yes [provider]  atorvastatin (LIPITOR) 40 MG tablet Take 40 mg by mouth daily. 03/15/17  Yes [provider]  brimonidine (ALPHAGAN) 0.2 % ophthalmic solution Place 1 drop into both eyes 2 (two) times daily. 03/15/17  Yes [provider]  CALCIUM 600/VITAMIN D 600-400 MG-UNIT TABS Take 1 tablet by mouth 2 (two) times daily. 06/03/20  Yes [provider]  carvedilol (COREG) 12.5 MG tablet Take 12.5 mg by mouth 2 (two) times daily. 03/13/20  Yes [provider]  gabapentin (NEURONTIN) 100 MG capsule  Take 100 mg by mouth 3 (three) times daily. 03/15/17  Yes [provider]  IBU 800 MG tablet Take 800 mg by mouth 3 (three) times daily. 04/22/17  Yes [provider]  levothyroxine (SYNTHROID) 50 MCG tablet Take 50 mcg by mouth daily. 03/13/20  Yes [provider]  metFORMIN (GLUCOPHAGE) 500 MG tablet Take 1 tablet by mouth 2 (two) times daily. 03/24/17  Yes [provider]  potassium chloride (MICRO-K) 10 MEQ CR capsule Take 10 mEq by mouth daily. 03/13/20  Yes [provider]  tiZANidine (ZANAFLEX) 4 MG tablet Take 4 mg by mouth 2 (two) times daily as needed. 04/18/17  Yes [provider]  TRADJENTA 5 MG TABS tablet Take 5 mg by mouth daily. 04/03/20  Yes [provider]  VITAMIN D-1000 MAX ST 25 MCG (1000 UT) tablet Take 1,000 Units by mouth daily. 03/13/20  Yes [provider]  VOLTAREN 1 % GEL  SMARTSIG:Sparingly Topical Twice Daily PRN 03/28/20  Yes [provider]     Critical care time: 46 minutes       Betsey Holiday, AGACNP-BC Acute Care Nurse Practitioner [facility]   [phone] / [phone] Please see Amion for pager details.

## 2020-09-02 ENCOUNTER — Encounter: Payer: Self-pay | Admitting: Internal Medicine

## 2020-09-02 ENCOUNTER — Inpatient Hospital Stay: Payer: Medicare Other

## 2020-09-02 DIAGNOSIS — E119 Type 2 diabetes mellitus without complications: Secondary | ICD-10-CM

## 2020-09-02 DIAGNOSIS — R571 Hypovolemic shock: Secondary | ICD-10-CM

## 2020-09-02 DIAGNOSIS — E860 Dehydration: Secondary | ICD-10-CM

## 2020-09-02 DIAGNOSIS — R001 Bradycardia, unspecified: Secondary | ICD-10-CM

## 2020-09-02 DIAGNOSIS — E039 Hypothyroidism, unspecified: Secondary | ICD-10-CM

## 2020-09-02 LAB — CBC
HCT: 30.7 % — ABNORMAL LOW (ref 36.0–46.0)
Hemoglobin: 9.9 g/dL — ABNORMAL LOW (ref 12.0–15.0)
MCH: 28.8 pg (ref 26.0–34.0)
MCHC: 32.2 g/dL (ref 30.0–36.0)
MCV: 89.2 fL (ref 80.0–100.0)
Platelets: 336 10*3/uL (ref 150–400)
RBC: 3.44 MIL/uL — ABNORMAL LOW (ref 3.87–5.11)
RDW: 13.4 % (ref 11.5–15.5)
WBC: 9.9 10*3/uL (ref 4.0–10.5)
nRBC: 0 % (ref 0.0–0.2)

## 2020-09-02 LAB — BASIC METABOLIC PANEL
Anion gap: 10 (ref 5–15)
BUN: 38 mg/dL — ABNORMAL HIGH (ref 8–23)
CO2: 24 mmol/L (ref 22–32)
Calcium: 8.7 mg/dL — ABNORMAL LOW (ref 8.9–10.3)
Chloride: 101 mmol/L (ref 98–111)
Creatinine, Ser: 1.81 mg/dL — ABNORMAL HIGH (ref 0.44–1.00)
GFR, Estimated: 27 mL/min — ABNORMAL LOW (ref >60)
Glucose, Bld: 117 mg/dL — ABNORMAL HIGH (ref 70–99)
Potassium: 3.5 mmol/L (ref 3.5–5.1)
Sodium: 135 mmol/L (ref 135–145)

## 2020-09-02 LAB — PHOSPHORUS
Phosphorus: 2.8 mg/dL (ref 2.5–4.6)
Phosphorus: 3.4 mg/dL (ref 2.5–4.6)

## 2020-09-02 LAB — GLUCOSE, CAPILLARY
Glucose-Capillary: 120 mg/dL — ABNORMAL HIGH (ref 70–99)
Glucose-Capillary: 79 mg/dL (ref 70–99)
Glucose-Capillary: 96 mg/dL (ref 70–99)
Glucose-Capillary: 90 mg/dL (ref 70–99)
Glucose-Capillary: 76 mg/dL (ref 70–99)

## 2020-09-02 LAB — LACTIC ACID, PLASMA: Lactic Acid, Venous: 1.9 mmol/L (ref 0.5–1.9)

## 2020-09-02 LAB — PROCALCITONIN: Procalcitonin: <0.1 ng/mL

## 2020-09-02 LAB — TSH: TSH: 0.906 u[IU]/mL (ref 0.350–4.500)

## 2020-09-02 LAB — MAGNESIUM: Magnesium: 2.7 mg/dL — ABNORMAL HIGH (ref 1.7–2.4)

## 2020-09-02 LAB — MRSA PCR SCREENING: MRSA by PCR: NEGATIVE

## 2020-09-02 MED ORDER — ATORVASTATIN CALCIUM 20 MG PO TABS
40.0000 mg | ORAL_TABLET | Freq: Every day | ORAL | Status: DC
  Administered 2020-09-02 – 2020-09-04 (×3): 40 mg via ORAL
  Filled 2020-09-02 (×3): qty 2

## 2020-09-02 MED ORDER — ENSURE ENLIVE PO LIQD
237.0000 mL | Freq: Two times a day (BID) | ORAL | Status: DC
  Administered 2020-09-02 – 2020-09-03 (×2): 237 mL via ORAL

## 2020-09-02 MED ORDER — CHLORHEXIDINE GLUCONATE CLOTH 2 % EX PADS
6.0000 | MEDICATED_PAD | Freq: Every day | CUTANEOUS | Status: DC
  Administered 2020-09-02: 6 via TOPICAL

## 2020-09-02 MED ORDER — BRIMONIDINE TARTRATE 0.2 % OP SOLN
1.0000 [drp] | Freq: Two times a day (BID) | OPHTHALMIC | Status: DC
  Administered 2020-09-02 – 2020-09-04 (×5): 1 [drp] via OPHTHALMIC
  Filled 2020-09-02 (×2): qty 5

## 2020-09-02 MED ORDER — LEVOTHYROXINE SODIUM 50 MCG PO TABS
50.0000 ug | ORAL_TABLET | Freq: Every day | ORAL | Status: DC
  Administered 2020-09-02 – 2020-09-04 (×3): 50 ug via ORAL
  Filled 2020-09-02 (×3): qty 1

## 2020-09-02 MED ORDER — POTASSIUM CHLORIDE 10 MEQ/100ML IV SOLN
10.0000 meq | INTRAVENOUS | Status: AC
  Administered 2020-09-02: 10 meq via INTRAVENOUS
  Filled 2020-09-02: qty 100

## 2020-09-02 MED ORDER — ADULT MULTIVITAMIN W/MINERALS CH
1.0000 | ORAL_TABLET | Freq: Every day | ORAL | Status: DC
  Administered 2020-09-03 – 2020-09-04 (×2): 1 via ORAL
  Filled 2020-09-02 (×2): qty 1

## 2020-09-02 MED ORDER — PANTOPRAZOLE SODIUM 40 MG PO TBEC
40.0000 mg | DELAYED_RELEASE_TABLET | Freq: Every day | ORAL | Status: DC
  Administered 2020-09-02 – 2020-09-04 (×3): 40 mg via ORAL
  Filled 2020-09-02 (×2): qty 1

## 2020-09-02 NOTE — Progress Notes (Signed)
   09/02/20 1400  Clinical Encounter Type  Visited With Patient and family together  Visit Type Initial;Spiritual support;Social support  Referral From Chaplain  Consult/Referral To Chaplain   Posey Boyer visited PT while doing rounds. PT's daughter is a Haematologist here at the hospital. Chaplain visited with PT and her daughter who was at bedside. PT received news that she will not be going home yet, while chaplain was present. PT was able to express her emotions. She stated she was ready to go but will listen to the suggestion of the doctor. Chaplain made a safe space for PT to express her emotions. Chaplain ministered with prayer, presence, and active listening.

## 2020-09-02 NOTE — Progress Notes (Signed)
   09/02/20 0900  Clinical Encounter Type  Visited With Patient and family together  Visit Type Initial  Referral From Chaplain  Consult/Referral To Chaplain  Spiritual Encounters  Spiritual Needs Prayer;Emotional  Chaplain Shadavia Dampier visited ICU 9, Pt Allison Rogers. Pt's daughter who is an employee here at the hospital was by her mother's bedside. I provided reflective listening, emotional and spiritual support and prayer was given and received at the end of the visit.

## 2020-09-02 NOTE — Plan of Care (Signed)
Care Reviewed with family.

## 2020-09-02 NOTE — Progress Notes (Signed)
Initial Nutrition Assessment  DOCUMENTATION CODES:   Not applicable  INTERVENTION:   Ensure Enlive po BID, each supplement provides 350 kcal and 20 grams of protein  Carnation Instant Breakfast with lactaid milk TID- each packet provides 130kcal and 5g protein   MVI po daily   Pt at high refeed risk; recommend monitor potassium, magnesium and phosphorus labs daily until stable  NUTRITION DIAGNOSIS:   Inadequate oral intake related to acute illness as evidenced by per patient/family report.  GOAL:   Patient will meet greater than or equal to 90% of their needs  MONITOR:   PO intake,Supplement acceptance,Labs,Weight trends,Skin,I & O's  REASON FOR ASSESSMENT:   Consult Assessment of nutrition requirement/status  ASSESSMENT:   86 y/o female with h/o hypothyroidism, DM, HTN, and HLD who is admitted with FTT, hypovolemic shock and AKI   Met with patient and pt's daughter in room today. Pt reports poor appetite and oral intake pta. Daughter at bedside reports that pt has never been a big eater. Pt reports that she just has no desire to eat; she denies any nausea, taste changes or abdominal pain. Pt reports that she does not like nutritional supplements. Pt reports that she does like ice cream but reports that it gives her diarrhea as she is lactose intolerant. RD discussed with pt the importance of adequate nutrition needed to preserve lean muscle. Pt reluctantly agrees to try chocolate supplements. Per daughter report, pt ate 50% of her breakfast this morning which included eggs, grits, sausage and toast. RD will add supplements and MVI to help pt meet her estimated needs. Per chart, pt is down 32lbs(17%) from her last documented weight in August 2020. There are no recent documented weights to determine if any significant weight changes.    Medications reviewed and include: heparin, synthroid  Labs reviewed: K 3.5 wnl, BUN 38(H), creat 1.81(H), P 3.4 wnl, Mg 2.7(H) Hgb 9.9(L),  Hct 30.7(L) cbgs- 120, 79, 96 x 24 hrs  NUTRITION - FOCUSED PHYSICAL EXAM:  Flowsheet Row Most Recent Value  Orbital Region No depletion  Upper Arm Region Moderate depletion  Thoracic and Lumbar Region No depletion  Buccal Region No depletion  Temple Region Mild depletion  Clavicle Bone Region Severe depletion  Clavicle and Acromion Bone Region Severe depletion  Scapular Bone Region No depletion  Dorsal Hand No depletion  Patellar Region Mild depletion  Anterior Thigh Region No depletion  Posterior Calf Region No depletion  Edema (RD Assessment) Mild  Hair Reviewed  Eyes Reviewed  Mouth Reviewed  Skin Reviewed  Nails Reviewed     Diet Order:   Diet Order            Diet regular Room service appropriate? Yes; Fluid consistency: Thin  Diet effective now                EDUCATION NEEDS:   Education needs have been addressed  Skin:  Skin Assessment: Reviewed RN Assessment  Last BM:  5/24  Height:   Ht Readings from Last 1 Encounters:  09/02/20 5' 6" (1.676 m)    Weight:   Wt Readings from Last 1 Encounters:  09/02/20 72.6 kg    Ideal Body Weight:  59 kg  BMI:  Body mass index is 25.83 kg/m.  Estimated Nutritional Needs:   Kcal:  1600-1800kcal/day  Protein:  80-90g/day  Fluid:  1.6-1.8L/day  Koleen Distance MS, RD, LDN Please refer to [facility] for RD and/or RD on-call/weekend/after hours pager

## 2020-09-03 ENCOUNTER — Encounter: Payer: Self-pay | Admitting: Internal Medicine

## 2020-09-03 ENCOUNTER — Inpatient Hospital Stay (HOSPITAL_COMMUNITY)
Admit: 2020-09-03 | Discharge: 2020-09-03 | Disposition: A | Discharge status: Home / Self Care | Payer: Medicare Other | Attending: Pulmonary Disease | Admitting: Pulmonary Disease

## 2020-09-03 DIAGNOSIS — R001 Bradycardia, unspecified: Secondary | ICD-10-CM | POA: Diagnosis not present

## 2020-09-03 DIAGNOSIS — I428 Other cardiomyopathies: Secondary | ICD-10-CM | POA: Diagnosis not present

## 2020-09-03 DIAGNOSIS — R571 Hypovolemic shock: Secondary | ICD-10-CM | POA: Diagnosis not present

## 2020-09-03 LAB — CBC WITH DIFFERENTIAL/PLATELET
Abs Immature Granulocytes: 0.05 10*3/uL (ref 0.00–0.07)
Basophils Absolute: 0.1 10*3/uL (ref 0.0–0.1)
Basophils Relative: 1 %
Eosinophils Absolute: 0.1 10*3/uL (ref 0.0–0.5)
Eosinophils Relative: 2 %
HCT: 34.5 % — ABNORMAL LOW (ref 36.0–46.0)
Hemoglobin: 11.2 g/dL — ABNORMAL LOW (ref 12.0–15.0)
Immature Granulocytes: 1 %
Lymphocytes Relative: 31 %
Lymphs Abs: 1.8 10*3/uL (ref 0.7–4.0)
MCH: 28.4 pg (ref 26.0–34.0)
MCHC: 32.5 g/dL (ref 30.0–36.0)
MCV: 87.6 fL (ref 80.0–100.0)
Monocytes Absolute: 0.6 10*3/uL (ref 0.1–1.0)
Monocytes Relative: 11 %
Neutro Abs: 3.2 10*3/uL (ref 1.7–7.7)
Neutrophils Relative %: 54 %
Platelets: 379 10*3/uL (ref 150–400)
RBC: 3.94 MIL/uL (ref 3.87–5.11)
RDW: 13.3 % (ref 11.5–15.5)
WBC: 5.8 10*3/uL (ref 4.0–10.5)
nRBC: 0 % (ref 0.0–0.2)

## 2020-09-03 LAB — RENAL FUNCTION PANEL
Albumin: 3 g/dL — ABNORMAL LOW (ref 3.5–5.0)
Anion gap: 9 (ref 5–15)
BUN: 28 mg/dL — ABNORMAL HIGH (ref 8–23)
CO2: 27 mmol/L (ref 22–32)
Calcium: 9 mg/dL (ref 8.9–10.3)
Chloride: 102 mmol/L (ref 98–111)
Creatinine, Ser: 1.46 mg/dL — ABNORMAL HIGH (ref 0.44–1.00)
GFR, Estimated: 35 mL/min — ABNORMAL LOW (ref >60)
Glucose, Bld: 109 mg/dL — ABNORMAL HIGH (ref 70–99)
Phosphorus: 2.2 mg/dL — ABNORMAL LOW (ref 2.5–4.6)
Potassium: 3.4 mmol/L — ABNORMAL LOW (ref 3.5–5.1)
Sodium: 138 mmol/L (ref 135–145)

## 2020-09-03 LAB — ECHOCARDIOGRAM COMPLETE
AR max vel: 1.53 cm2
AV Area VTI: 1.33 cm2
AV Area mean vel: 1.35 cm2
AV Mean grad: 6 mmHg
AV Peak grad: 11.6 mmHg
Ao pk vel: 1.7 m/s
Area-P 1/2: 2.84 cm2
Height: 66 in
MV VTI: 1.7 cm2
S' Lateral: 1.9 cm
Weight: 2843.05 oz

## 2020-09-03 LAB — GLUCOSE, CAPILLARY
Glucose-Capillary: 116 mg/dL — ABNORMAL HIGH (ref 70–99)
Glucose-Capillary: 122 mg/dL — ABNORMAL HIGH (ref 70–99)
Glucose-Capillary: 133 mg/dL — ABNORMAL HIGH (ref 70–99)
Glucose-Capillary: 156 mg/dL — ABNORMAL HIGH (ref 70–99)
Glucose-Capillary: 85 mg/dL (ref 70–99)
Glucose-Capillary: 92 mg/dL (ref 70–99)
Glucose-Capillary: 98 mg/dL (ref 70–99)

## 2020-09-03 LAB — MAGNESIUM: Magnesium: 2.4 mg/dL (ref 1.7–2.4)

## 2020-09-03 LAB — PROCALCITONIN: Procalcitonin: <0.1 ng/mL

## 2020-09-03 MED ORDER — AMLODIPINE BESYLATE 5 MG PO TABS
2.5000 mg | ORAL_TABLET | Freq: Every day | ORAL | Status: DC
  Administered 2020-09-03: 2.5 mg via ORAL
  Filled 2020-09-03: qty 1

## 2020-09-03 MED ORDER — K PHOS MONO-SOD PHOS DI & MONO 155-852-130 MG PO TABS
250.0000 mg | ORAL_TABLET | Freq: Three times a day (TID) | ORAL | Status: DC
  Administered 2020-09-03 – 2020-09-04 (×4): 250 mg via ORAL
  Filled 2020-09-03 (×10): qty 1

## 2020-09-03 MED ORDER — HYDRALAZINE HCL 20 MG/ML IJ SOLN
10.0000 mg | Freq: Four times a day (QID) | INTRAMUSCULAR | Status: DC | PRN
  Administered 2020-09-03: 10 mg via INTRAVENOUS
  Filled 2020-09-03: qty 1

## 2020-09-03 MED ORDER — AMLODIPINE BESYLATE 5 MG PO TABS
2.5000 mg | ORAL_TABLET | Freq: Once | ORAL | Status: AC
  Administered 2020-09-03: 2.5 mg via ORAL
  Filled 2020-09-03: qty 1

## 2020-09-03 MED ORDER — POTASSIUM CHLORIDE CRYS ER 20 MEQ PO TBCR
40.0000 meq | EXTENDED_RELEASE_TABLET | Freq: Once | ORAL | Status: AC
  Administered 2020-09-03: 40 meq via ORAL
  Filled 2020-09-03: qty 2

## 2020-09-03 MED ORDER — AMLODIPINE BESYLATE 5 MG PO TABS
5.0000 mg | ORAL_TABLET | Freq: Every day | ORAL | Status: DC
  Administered 2020-09-04: 5 mg via ORAL
  Filled 2020-09-03: qty 1

## 2020-09-03 MED ORDER — ACETAMINOPHEN 325 MG PO TABS
650.0000 mg | ORAL_TABLET | ORAL | Status: DC | PRN
  Administered 2020-09-03: 650 mg via ORAL
  Filled 2020-09-03: qty 2

## 2020-09-03 MED ORDER — MELATONIN 5 MG PO TABS
5.0000 mg | ORAL_TABLET | Freq: Every evening | ORAL | Status: DC | PRN
  Administered 2020-09-03: 5 mg via ORAL
  Filled 2020-09-03: qty 1

## 2020-09-03 NOTE — TOC Progression Note (Signed)
Transition of Care [facility]) - Progression Note    Patient Details  Name: Allison Rogers MRN: [id number] Date of Birth: 14-Mar-1935  Transition of Care [facility]) CM/SW Contact  Caryn Section, RN Phone Number: 09/03/2020, 1:14 PM  Clinical Narrative:   TOC in to see patient at bedside.  Patient lives alone with assistance from daughter.  She currently has no home health services and walks with a cane.  She has no concerns about getting to appointments or getting medications, as her family assists with this.    Palliative care received referral and attempted to see patient, notes state they will return.  TOC will follow for needs.             Expected Discharge Plan and Services                                                 Social Determinants of Health (SDOH) Interventions    Readmission Risk Interventions No flowsheet data found.

## 2020-09-03 NOTE — Progress Notes (Signed)
     Referral received for Allison Rogers :goals of care discussion. Chart reviewed and updates received from RN. Tech is at the bedside preparing to start echo.   PMT will re-attempt to see patient at a later time/date. Detailed note and recommendations to follow once GOC has been completed.   Thank you for your referral and allowing PMT to assist in Mrs. Allison Rogers care.   Willette Alma, AGPCNP-BC Palliative Medicine Team  Phone: [phone] Pager: [phone] Amion: N. [place]   NO CHARGE

## 2020-09-03 NOTE — Progress Notes (Signed)
Progress Note    Allison Rogers  BJY:[id number] DOB: [birth year]  DOA: 09/01/2020 PCP: Leanna SatoMiles, Linda M, MD      Brief Narrative:    Medical records reviewed and are as summarized below:  Allison Rogers is a 86 y.o. female with medical history significant for hypertension, hypothyroidism, hyperlipidemia, type II DM, was brought to the hospital because of generalized weakness and lethargy.  She was found to have hypotension.  She was admitted to the ICU for hypovolemic shock, bradycardia, syncope and AKI.  She was treated with IV fluids.  Coreg was discontinued because of hypotension and bradycardia.      Assessment/Plan:   Active Problems:   Bradycardia   Hypothyroidism   Type 2 diabetes mellitus (HCC)   Hypovolemic shock (HCC)   Nutrition Problem: Inadequate oral intake Etiology: acute illness  Signs/Symptoms: per patient/family report   Body mass index is 28.68 kg/m.    Hypovolemic shock: Resolved.  Sinus bradycardia: Coreg has been discontinued.  Hypertension, hypertensive urgency: BP went up to 220/78.  Start amlodipine.  IV hydralazine as needed for severe hypertension  Hypokalemia and hypophosphatemia: Replete potassium and phosphorus.  Monitor levels.  S/p suspected syncope: 2D echo showed EF estimated at 60 to 65%, grade 1 diastolic dysfunction.  Generalized weakness: PT evaluation.  Diet Order            Diet regular Room service appropriate? Yes; Fluid consistency: Thin  Diet effective now                    Consultants:  Intensivist  Procedures:  None    Medications:   . amLODipine  2.5 mg Oral Daily  . atorvastatin  40 mg Oral Daily  . brimonidine  1 drop Both Eyes BID  . Chlorhexidine Gluconate Cloth  6 each Topical Q0600  . feeding supplement  237 mL Oral BID BM  . heparin  5,000 Units Subcutaneous Q8H  . levothyroxine  50 mcg Oral Daily  . multivitamin with minerals  1 tablet Oral Daily  . pantoprazole  40 mg Oral  Q1200  . potassium chloride  40 mEq Oral Once   Continuous Infusions: . sodium chloride 20 mL/hr at 09/02/20 1020     Anti-infectives (From admission, onward)   None             Family Communication/Anticipated D/C date and plan/Code Status   DVT prophylaxis: heparin injection 5,000 Units Start: 09/02/20 0000 SCDs Start: 09/01/20 2353     Code Status: Partial Code  Family Communication: None Disposition Plan:    Status is: Inpatient  Remains inpatient appropriate because:Inpatient level of care appropriate due to severity of illness   Dispo: The patient is from: Home              Anticipated d/c is to: Home              Patient currently is not medically stable to d/c.   Difficult to place patient No           Subjective:   C/o.  Objective:    Vitals:   09/02/20 1724 09/02/20 1952 09/03/20 0401 09/03/20 0755  BP: (!) 159/59 (!) 164/54 (!) 154/61 (!) 188/72  Pulse: (!) 52 (!) 54 (!) 58 60  Resp: 17 18 20 16   Temp: 98.9 F (37.2 C) 99 F (37.2 C) 98.4 F (36.9 C) 98.7 F (37.1 C)  TempSrc:  Oral Oral Oral  SpO2: 98% 100% 97%  98%  Weight:    80.6 kg  Height:       No data found.   Intake/Output Summary (Last 24 hours) at 09/03/2020 1017 Last data filed at 09/03/2020 0400 Gross per 24 hour  Intake 813.33 ml  Output 1900 ml  Net -1086.67 ml   Filed Weights   09/01/20 2039 09/02/20 0240 09/03/20 0755  Weight: 72.6 kg 72.6 kg 80.6 kg    Exam:   GEN: NAD SKIN: Bony growth on the anterior aspect of right diastolic (chronic)  EYES: No pallor or icterus ENT: MMM CV: RRR PULM: CTA B ABD: soft, ND, NT, +BS CNS: AAO x 3, non focal EXT: No edema or tenderness       Data Reviewed:   I have personally reviewed following labs and imaging studies:  Labs: Labs show the following:   Basic Metabolic Panel: Recent Labs  Lab 08/31/20 2302 09/01/20 2046 09/01/20 2046 09/02/20 0409 09/03/20 0813  NA  --  135  --  135 138  K   --  2.8*   < > 3.5 3.4*  CL  --  97*  --  101 102  CO2  --  25  --  24 27  GLUCOSE  --  187*  --  117* 109*  BUN  --  41*  --  38* 28*  CREATININE  --  2.04*  --  1.81* 1.46*  CALCIUM  --  8.6*  --  8.7* 9.0  MG  --  2.4  --  2.7* 2.4  PHOS 2.8  --   --  3.4 2.2*   < > = values in this interval not displayed.   GFR Estimated Creatinine Clearance: 30.7 mL/min (A) (by C-G formula based on SCr of 1.46 mg/dL (H)). Liver Function Tests: Recent Labs  Lab 09/01/20 2046 09/03/20 0813  AST 31  --   ALT 25  --   ALKPHOS 58  --   BILITOT 0.7  --   PROT 7.3  --   ALBUMIN 3.0* 3.0*   No results for input(s): LIPASE, AMYLASE in the last 168 hours. No results for input(s): AMMONIA in the last 168 hours. Coagulation profile No results for input(s): INR, PROTIME in the last 168 hours.  CBC: Recent Labs  Lab 09/01/20 2046 09/02/20 0409 09/03/20 0813  WBC 7.6 9.9 5.8  NEUTROABS 5.6  --  3.2  HGB 10.0* 9.9* 11.2*  HCT 32.1* 30.7* 34.5*  MCV 90.9 89.2 87.6  PLT 375 336 379   Cardiac Enzymes: No results for input(s): CKTOTAL, CKMB, CKMBINDEX, TROPONINI in the last 168 hours. BNP (last 3 results) No results for input(s): PROBNP in the last 8760 hours. CBG: Recent Labs  Lab 09/02/20 1632 09/02/20 2104 09/03/20 0010 09/03/20 0405 09/03/20 0757  GLUCAP 90 76 85 98 92   D-Dimer: No results for input(s): DDIMER in the last 72 hours. Hgb A1c: No results for input(s): HGBA1C in the last 72 hours. Lipid Profile: No results for input(s): CHOL, HDL, LDLCALC, TRIG, CHOLHDL, LDLDIRECT in the last 72 hours. Thyroid function studies: Recent Labs    09/02/20 0409  TSH 0.906   Anemia work up: No results for input(s): VITAMINB12, FOLATE, FERRITIN, TIBC, IRON, RETICCTPCT in the last 72 hours. Sepsis Labs: Recent Labs  Lab 09/01/20 2046 09/01/20 2302 09/02/20 0409 09/03/20 0525 09/03/20 0813  PROCALCITON <0.10  --  <0.10 <0.10  --   WBC 7.6  --  9.9  --  5.8  LATICACIDVEN 2.5*  2.5* 1.9  --   --     Microbiology Recent Results (from the past 240 hour(s))  Resp Panel by RT-PCR (Flu A&B, Covid) Nasopharyngeal Swab     Status: None   Collection Time: 09/01/20  8:46 PM   Specimen: Nasopharyngeal Swab; Nasopharyngeal(NP) swabs in vial transport medium  Result Value Ref Range Status   SARS Coronavirus 2 by RT PCR NEGATIVE NEGATIVE Final    Comment: (NOTE) SARS-CoV-2 target nucleic acids are NOT DETECTED.  The SARS-CoV-2 RNA is generally detectable in upper respiratory specimens during the acute phase of infection. The lowest concentration of SARS-CoV-2 viral copies this assay can detect is 138 copies/mL. A negative result does not preclude SARS-Cov-2 infection and should not be used as the sole basis for treatment or other patient management decisions. A negative result may occur with  improper specimen collection/handling, submission of specimen other than nasopharyngeal swab, presence of viral mutation(s) within the areas targeted by this assay, and inadequate number of viral copies(<138 copies/mL). A negative result must be combined with clinical observations, patient history, and epidemiological information. The expected result is Negative.  Fact Sheet for Patients:  BloggerCourse.com  Fact Sheet for Healthcare Providers:  SeriousBroker.it  This test is no t yet approved or cleared by the [place] FDA and  has been authorized for detection and/or diagnosis of SARS-CoV-2 by FDA under an Emergency Use Authorization (EUA). This EUA will remain  in effect (meaning this test can be used) for the duration of the COVID-19 declaration under Section 564(b)(1) of the Act, 21 U.S.C.section 360bbb-3(b)(1), unless the authorization is terminated  or revoked sooner.       Influenza A by PCR NEGATIVE NEGATIVE Final   Influenza B by PCR NEGATIVE NEGATIVE Final    Comment: (NOTE) The Xpert Xpress  SARS-CoV-2/FLU/RSV plus assay is intended as an aid in the diagnosis of influenza from Nasopharyngeal swab specimens and should not be used as a sole basis for treatment. Nasal washings and aspirates are unacceptable for Xpert Xpress SARS-CoV-2/FLU/RSV testing.  Fact Sheet for Patients: BloggerCourse.com  Fact Sheet for Healthcare Providers: SeriousBroker.it  This test is not yet approved or cleared by the [place] FDA and has been authorized for detection and/or diagnosis of SARS-CoV-2 by FDA under an Emergency Use Authorization (EUA). This EUA will remain in effect (meaning this test can be used) for the duration of the COVID-19 declaration under Section 564(b)(1) of the Act, 21 U.S.C. section 360bbb-3(b)(1), unless the authorization is terminated or revoked.  Performed at [facility], [address] [place], [place] [address]   Culture, blood (routine x 2)     Status: None (Preliminary result)   Collection Time: 09/01/20  8:46 PM   Specimen: BLOOD  Result Value Ref Range Status   Specimen Description BLOOD  RIGHT ARM  Final   Special Requests   Final    BOTTLES DRAWN AEROBIC AND ANAEROBIC Blood Culture adequate volume   Culture   Final    NO GROWTH 2 DAYS Performed at [facility], [address] [place], [place] [address]    Report Status PENDING  Incomplete  Culture, blood (routine x 2)     Status: None (Preliminary result)   Collection Time: 09/01/20  8:46 PM   Specimen: BLOOD  Result Value Ref Range Status   Specimen Description BLOOD LEFT ARM  Final   Special Requests   Final    BOTTLES DRAWN AEROBIC AND ANAEROBIC Blood Culture results may not be  optimal due to an inadequate volume of blood received in culture bottles   Culture   Final    NO GROWTH 2 DAYS Performed at [facility], [address] [place], [place] [address]    Report Status PENDING  Incomplete  MRSA PCR  Screening     Status: None   Collection Time: 09/02/20  2:47 AM   Specimen: Nasopharyngeal  Result Value Ref Range Status   MRSA by PCR NEGATIVE NEGATIVE Final    Comment:        The GeneXpert MRSA Assay (FDA approved for NASAL specimens only), is one component of a comprehensive MRSA colonization surveillance program. It is not intended to diagnose MRSA infection nor to guide or monitor treatment for MRSA infections. Performed at [facility], [address] [place], [place] [address]     Procedures and diagnostic studies:  CT ABDOMEN PELVIS WO CONTRAST  Result Date: 09/02/2020 CLINICAL DATA:  Abdominal distension for 1 day EXAM: CT ABDOMEN AND PELVIS WITHOUT CONTRAST TECHNIQUE: Multidetector CT imaging of the abdomen and pelvis was performed following the standard protocol without IV contrast. COMPARISON:  None FINDINGS: Lower chest: Mild bibasilar atelectasis/scarring is noted. Hepatobiliary: No focal liver abnormality is seen. No gallstones, gallbladder wall thickening, or biliary dilatation. Pancreas: Unremarkable. No pancreatic ductal dilatation or surrounding inflammatory changes. Spleen: Normal in size without focal abnormality. Adrenals/Urinary Tract: Left adrenal gland is within normal limits. Right adrenal gland demonstrates a large 5.8 cm myelolipoma. Hypodensities are noted in both kidneys most consistent with cysts. The largest of these on the left measures 6.7 cm in on the right 2.9 cm. No hydronephrosis is noted. Tiny nonobstructing left renal stone is noted. Bladder is partially distended. Stomach/Bowel: Colon shows diverticular change without definitive diverticulitis. No obstructive changes are seen. The appendix is within normal limits. No small bowel or gastric abnormality is seen. Vascular/Lymphatic: Aortic atherosclerosis. No enlarged abdominal or pelvic lymph nodes. Reproductive: Uterus and bilateral adnexa are unremarkable. Other: No abdominal wall hernia or  abnormality. No abdominopelvic ascites. Musculoskeletal: Degenerative changes of lumbar spine are noted. IMPRESSION: Bilateral renal cysts and tiny nonobstructing left renal stone. Diverticulosis without diverticulitis. Right adrenal myelolipoma Electronically Signed   By: Alcide Clever M.D.   On: 09/02/2020 16:53   CT Head Wo Contrast  Result Date: 09/01/2020 CLINICAL DATA:  Altered mental status, weakness, fixed gaze EXAM: CT HEAD WITHOUT CONTRAST TECHNIQUE: Contiguous axial images were obtained from the base of the skull through the vertex without intravenous contrast. COMPARISON:  MRI 12/03/2018 FINDINGS: Brain: Hypoattenuating foci in the basal ganglia likely reflect sequela of lacunar type infarct versus prominent perivascular spaces. No evidence of acute infarction, hemorrhage, hydrocephalus, extra-axial collection, visible mass lesion or mass effect. Symmetric prominence of the ventricles, cisterns and sulci compatible with parenchymal volume loss. Patchy areas of white matter hypoattenuation are most compatible with chronic microvascular angiopathy. Vascular: Atherosclerotic calcification of the carotid siphons. No hyperdense vessel. Skull: No calvarial fracture or suspicious osseous lesion. Question some mild left parietal scalp thickening without large hematoma. Sinuses/Orbits: Paranasal sinuses and mastoid air cells are predominantly clear. Included orbital structures are unremarkable. Other: Mild bilateral TMJ arthrosis. IMPRESSION: Question some mild left parietal scalp swelling, correlate for point tenderness. No large hematoma or calvarial fracture. No acute intracranial abnormality is evident on these unenhanced CT images. If there is persisting concern for acute infarction, MRI is more sensitive and specific for early features of ischemia. Hypoattenuating foci in the bilateral basal ganglia likely reflect sequela  of remote lacunar type infarcts versus perivascular spaces. Background of  parenchymal volume loss, microvascular angiopathy and intracranial atherosclerosis. Electronically Signed   By: Kreg Shropshire M.D.   On: 09/01/2020 21:40   DG Chest Portable 1 View  Result Date: 09/01/2020 CLINICAL DATA:  Weakness.  Mental status change. EXAM: PORTABLE CHEST 1 VIEW COMPARISON:  04/29/2017 FINDINGS: Low lung volumes. Mild cardiomegaly, accentuated by portable technique. Aortic atherosclerosis. Mild bronchovascular crowding without focal airspace disease. No pleural effusion or pneumothorax. Chronic change about both shoulders. No acute osseous abnormalities are seen. IMPRESSION: 1. Low lung volumes with bronchovascular crowding. 2. Mild cardiomegaly. Electronically Signed   By: Narda Rutherford M.D.   On: 09/01/2020 21:24               LOS: 2 days   Idella Lamontagne  [facility]   Pager on [email or web address]. If 7PM-7AM, please contact night-coverage at [email or web address]     09/03/2020, 10:17 AM

## 2020-09-03 NOTE — Progress Notes (Signed)
Brief Nutrition Note  Contacted by dining services to remove carnation instant breakfast from diet order. Family reports that pt is not tolerating the supplements well. Having diarrhea after consuming (both this AM and at lunch). Will monitor for adequate intake of meals and ensure.  Greig Castilla, RD, LDN Clinical Dietitian Pager on Amion

## 2020-09-03 NOTE — Progress Notes (Signed)
MD notified this AM about pts elevated BP. New medication ordered and administered at scheduled time. BP remains elevated 1 hour after med admin and MD notified of this as well. RN awaiting orders. Pt asymptomatic and resting comfortably. RN will continue to monitor and follow orders as necessary.

## 2020-09-03 NOTE — Evaluation (Signed)
Physical Therapy Evaluation Patient Details Name: Allison Rogers MRN: [id number] DOB: [birth year] Today's Date: 09/03/2020   History of Present Illness  Pt is an 86 yo female that presented to the ED for lethargy, weakness. Admitted for hypovolemic shock, bradycardia.  Clinical Impression  Patient alert, agreeable to PT, family at bedside. Denied pain, but at end of session up in chair, pt with grimacing and reported that her stomach is upset (RN aware). At baseline pt stated she lives alone with family assisting as needed, independent in ADLs, denies falls.  The patient demonstrated supine to sit with supervision, good sitting balance noted. Sit <> Stand with RW and CGA. She was able to ambulate ~59ft with RW and CGA. No LOB noted, mild antalgic gait of RLE that family/pt endorsed is baseline due to chronic R hip/knee pain. Decreased gait velocity noted and pt stated she was moving a little slower than normal. Overall the patient demonstrated mild deficits in strength, gait, balance, and endurance and would benefit from further skilled PT intervention. Recommendation is HHPT with intermittent supervision.  HR monitored with mobility, 70s sitting EOB, 90s while standing, and low 100s for ambulation, RN notified.     Follow Up Recommendations Home health PT;Supervision - Intermittent    Equipment Recommendations  None recommended by PT    Recommendations for Other Services       Precautions / Restrictions Precautions Precautions: Fall Restrictions Weight Bearing Restrictions: No      Mobility  Bed Mobility Overal bed mobility: Needs Assistance Bed Mobility: Supine to Sit     Supine to sit: Supervision          Transfers Overall transfer level: Needs assistance Equipment used: Rolling walker (2 wheeled) Transfers: Sit to/from Stand Sit to Stand: Min guard            Ambulation/Gait Ambulation/Gait assistance: Min guard Gait Distance (Feet): 65 Feet Assistive  device: Rolling walker (2 wheeled)       General Gait Details: step through gait pattern, mild antalgia noted for RLE (pt and family endorsed this is baseline). decreased velocity  Stairs            Wheelchair Mobility    Modified Rankin (Stroke Patients Only)       Balance Overall balance assessment: Needs assistance Sitting-balance support: Feet supported Sitting balance-Leahy Scale: Fair       Standing balance-Leahy Scale: Fair Standing balance comment: more comfortable with bilateral UE support                             Pertinent Vitals/Pain Pain Assessment: Faces Faces Pain Scale: Hurts a little bit Pain Location: abdominal pain at end of session, endorsed that her stomach is upset Pain Descriptors / Indicators: Grimacing Pain Intervention(s): Limited activity within patient's tolerance;Monitored during session;Repositioned    Home Living Family/patient expects to be discharged to:: Private residence Living Arrangements: Alone Available Help at Discharge: Family;Available 24 hours/day Type of Home: House Home Access: Stairs to enter Entrance Stairs-Rails: Right;Left;Can reach both Entrance Stairs-Number of Steps: 2 Home Layout: One level Home Equipment: Walker - 2 wheels;Walker - 4 wheels;Cane - single point      Prior Function Level of Independence: Independent with assistive device(s)         Comments: family assists with IADLs as needed. Pt independent with ambulation, ADLs. Ambulates with walker     Hand Dominance        Extremity/Trunk Assessment  Upper Extremity Assessment Upper Extremity Assessment: Overall WFL for tasks assessed    Lower Extremity Assessment Lower Extremity Assessment: RLE deficits/detail;LLE deficits/detail RLE Deficits / Details: grossly 4-/5, did report R knee pain (chronic) LLE Deficits / Details: grossly 4-/5       Communication   Communication: No difficulties  Cognition Arousal/Alertness:  Awake/alert Behavior During Therapy: WFL for tasks assessed/performed Overall Cognitive Status: Within Functional Limits for tasks assessed                                        General Comments      Exercises     Assessment/Plan    PT Assessment Patient needs continued PT services  PT Problem List Decreased strength;Decreased activity tolerance;Decreased mobility       PT Treatment Interventions DME instruction;Balance training;Gait training;Neuromuscular re-education;Stair training;Functional mobility training;Patient/family education;Therapeutic activities;Therapeutic exercise    PT Goals (Current goals can be found in the Care Plan section)  Acute Rehab PT Goals Patient Stated Goal: to go home PT Goal Formulation: With patient Time For Goal Achievement: 09/17/20 Potential to Achieve Goals: Good    Frequency Min 2X/week   Barriers to discharge        Co-evaluation               AM-PAC PT "6 Clicks" Mobility  Outcome Measure Help needed turning from your back to your side while in a flat bed without using bedrails?: None Help needed moving from lying on your back to sitting on the side of a flat bed without using bedrails?: None Help needed moving to and from a bed to a chair (including a wheelchair)?: None Help needed standing up from a chair using your arms (e.g., wheelchair or bedside chair)?: None Help needed to walk in hospital room?: A Little Help needed climbing 3-5 steps with a railing? : A Little 6 Click Score: 22    End of Session Equipment Utilized During Treatment: Gait belt Activity Tolerance: Patient tolerated treatment well Patient left: in chair;with call bell/phone within reach;with chair alarm set;with family/visitor present Nurse Communication: Mobility status;Other (comment) (HR) PT Visit Diagnosis: Other abnormalities of gait and mobility (R26.89);Muscle weakness (generalized) (M62.81);Other symptoms and signs involving  the nervous system (R29.898)    Time: 4765-4650 PT Time Calculation (min) (ACUTE ONLY): 23 min   Charges:   PT Evaluation $PT Eval Low Complexity: 1 Low PT Treatments $Therapeutic Exercise: 8-22 mins        Olga Coaster PT, DPT 3:33 PM,09/03/20

## 2020-09-03 NOTE — Plan of Care (Signed)
  Problem: Activity: Goal: Risk for activity intolerance will decrease Outcome: Progressing   Problem: Nutrition: Goal: Adequate nutrition will be maintained Outcome: Progressing   Problem: Safety: Goal: Ability to remain free from injury will improve Outcome: Progressing   

## 2020-09-03 NOTE — Progress Notes (Signed)
   09/03/20 0910  Clinical Encounter Type  Visited With Patient  Visit Type Follow-up;Spiritual support  Referral From Chaplain  Consult/Referral To Chaplain  Spiritual Encounters  Spiritual Needs Emotional  Chaplain Issachar Broady visited room 1A-136A, Pt Allison Rogers. Pt was in good spirits and is doing better. She stated she is a little cold but she feel so much better. I told her I would asked the nurse to bring her another blanket and she said yes, thank you. I provided a listening ear, and emotional support. Pt should be discharging soon.

## 2020-09-03 NOTE — Progress Notes (Signed)
*  PRELIMINARY RESULTS* Echocardiogram 2D Echocardiogram has been performed.  Allison Rogers 09/03/2020, 11:40 AM

## 2020-09-04 DIAGNOSIS — I959 Hypotension, unspecified: Secondary | ICD-10-CM | POA: Diagnosis not present

## 2020-09-04 DIAGNOSIS — E860 Dehydration: Principal | ICD-10-CM

## 2020-09-04 DIAGNOSIS — R001 Bradycardia, unspecified: Secondary | ICD-10-CM | POA: Diagnosis not present

## 2020-09-04 DIAGNOSIS — R531 Weakness: Secondary | ICD-10-CM

## 2020-09-04 DIAGNOSIS — R571 Hypovolemic shock: Secondary | ICD-10-CM

## 2020-09-04 DIAGNOSIS — Z7189 Other specified counseling: Secondary | ICD-10-CM | POA: Diagnosis not present

## 2020-09-04 DIAGNOSIS — Z515 Encounter for palliative care: Secondary | ICD-10-CM

## 2020-09-04 LAB — GLUCOSE, CAPILLARY
Glucose-Capillary: 112 mg/dL — ABNORMAL HIGH (ref 70–99)
Glucose-Capillary: 136 mg/dL — ABNORMAL HIGH (ref 70–99)
Glucose-Capillary: 132 mg/dL — ABNORMAL HIGH (ref 70–99)

## 2020-09-04 LAB — BASIC METABOLIC PANEL
Anion gap: 8 (ref 5–15)
BUN: 21 mg/dL (ref 8–23)
CO2: 28 mmol/L (ref 22–32)
Calcium: 8.9 mg/dL (ref 8.9–10.3)
Chloride: 103 mmol/L (ref 98–111)
Creatinine, Ser: 1.28 mg/dL — ABNORMAL HIGH (ref 0.44–1.00)
GFR, Estimated: 41 mL/min — ABNORMAL LOW (ref >60)
Glucose, Bld: 120 mg/dL — ABNORMAL HIGH (ref 70–99)
Potassium: 3.8 mmol/L (ref 3.5–5.1)
Sodium: 139 mmol/L (ref 135–145)

## 2020-09-04 LAB — PHOSPHORUS: Phosphorus: 3.1 mg/dL (ref 2.5–4.6)

## 2020-09-04 MED ORDER — AMLODIPINE BESYLATE 10 MG PO TABS: 10.0000 mg | ORAL_TABLET | Freq: Every day | ORAL | Status: DC

## 2020-09-04 MED ORDER — POTASSIUM CHLORIDE CRYS ER 20 MEQ PO TBCR
40.0000 meq | EXTENDED_RELEASE_TABLET | Freq: Once | ORAL | Status: AC
  Administered 2020-09-04: 40 meq via ORAL
  Filled 2020-09-04: qty 2

## 2020-09-04 MED ORDER — AMLODIPINE BESYLATE 5 MG PO TABS
5.0000 mg | ORAL_TABLET | Freq: Once | ORAL | Status: AC
  Administered 2020-09-04: 5 mg via ORAL
  Filled 2020-09-04: qty 1

## 2020-09-04 MED ORDER — HYDROCHLOROTHIAZIDE 12.5 MG PO CAPS
12.5000 mg | ORAL_CAPSULE | Freq: Every day | ORAL | Status: DC
  Administered 2020-09-04: 12.5 mg via ORAL

## 2020-09-04 MED ORDER — LISINOPRIL 20 MG PO TABS
40.0000 mg | ORAL_TABLET | Freq: Every day | ORAL | Status: DC
  Administered 2020-09-04: 40 mg via ORAL
  Filled 2020-09-04: qty 2

## 2020-09-04 MED ORDER — FUROSEMIDE 20 MG PO TABS: 20.0000 mg | ORAL_TABLET | Freq: Every day | ORAL | Status: DC

## 2020-09-04 NOTE — Discharge Summary (Signed)
Physician Discharge Summary  Allison Rogers JJK:[id number] DOB: 09/06/1934 DOA: 09/01/2020  PCP: Leanna Sato, MD  Admit date: 09/01/2020 Discharge date: 09/04/2020  Discharge disposition: Home with home health therapy   Recommendations for Outpatient Follow-Up:   Follow-up with PCP in 5 days   Discharge Diagnosis:   Active Problems:   Bradycardia   Hypothyroidism   Type 2 diabetes mellitus (HCC)   Hypovolemic shock (HCC)    Discharge Condition: Stable.  Diet recommendation:  Diet Order            Diet - low sodium heart healthy           Diet Carb Modified           Diet regular Room service appropriate? Yes; Fluid consistency: Thin  Diet effective now                   Code Status: Partial Code     Hospital Course:    Allison Rogers is a 86 y.o. female with medical history significant for hypertension, hypothyroidism, hyperlipidemia, type II DM, was brought to the hospital because of generalized weakness and lethargy.  She was found to have hypotension.  She was admitted to the ICU for hypovolemic shock, bradycardia, syncope and AKI.  She was treated with IV fluids.  Coreg was discontinued because of bradycardia and first-degree AV block.  She later developed hypertensive urgency.  She was started on amlodipine and HCTZ.  It was later discovered that patient was taking Lasix, lisinopril, clonidine and amlodipine prior to admission.  However, these medications were not listed on her admission med rec.  Clonidine was discontinued at discharge because of sinus bradycardia and first-degree AV block.  Her condition has improved and she is deemed stable for discharge to home today.  Discharge plan was discussed with the patient and she verbalized understanding.      Medical Consultants:    Intensivist    Discharge Exam:    Vitals:   09/03/20 2352 09/04/20 0503 09/04/20 0808 09/04/20 1100  BP: (!) 140/55 (!) 155/65 (!) 192/80 (!) 160/51  Pulse: 62  (!) 55 71 63  Resp: 18 20 20 16   Temp: 98.6 F (37 C) 98.2 F (36.8 C) 98.9 F (37.2 C) 98.7 F (37.1 C)  TempSrc: Oral Oral Oral Oral  SpO2: 98% 98% 100% 98%  Weight:      Height:         GEN: NAD SKIN: Warm and dry EYES: No pallor or icterus ENT: MMM CV: RRR PULM: CTA B ABD: soft, ND, NT, +BS CNS: AAO x 3, non focal EXT: No edema or tenderness   The results of significant diagnostics from this hospitalization (including imaging, microbiology, ancillary and laboratory) are listed below for reference.     Procedures and Diagnostic Studies:   ECHOCARDIOGRAM COMPLETE  Result Date: 09/03/2020    ECHOCARDIOGRAM REPORT   Patient Name:   Allison Rogers Date of Exam: 09/03/2020 Medical Rec #:  09/05/2020      Height:       66.0 in Accession #:    [id number]     Weight:       177.7 lb Date of Birth:  1935-01-09      BSA:          1.902 m Patient Age:    86 years       BP:           188/72 mmHg Patient Gender: F  HR:           59 bpm. Exam Location:  [facility] Procedure: 2D Echo, Color Doppler and Cardiac Doppler Indications:     I42.9 Cardiomyopathy-unspecified  History:         Patient has no prior history of Echocardiogram examinations.                  Risk Factors:Hypertension, Diabetes and Dyslipidemia.  Sonographer:     Humphrey RollsJoan Heiss RDCS (AE) Referring Phys:  [id number] BRITTON L RUST-CHESTER Diagnosing Phys: Debbe OdeaBrian Agbor-Etang MD  Sonographer Comments: Suboptimal subcostal window. Global longitudinal strain was attempted. IMPRESSIONS  1. Left ventricular ejection fraction, by estimation, is 60 to 65%. The left ventricle has normal function. The left ventricle has no regional wall motion abnormalities. There is mild left ventricular hypertrophy. Left ventricular diastolic parameters are consistent with Grade I diastolic dysfunction (impaired relaxation). The average left ventricular global longitudinal strain is -18.0 %.  2. Right ventricular systolic function is normal. The right  ventricular size is normal.  3. The mitral valve is normal in structure. No evidence of mitral valve regurgitation.  4. The aortic valve is tricuspid. Aortic valve regurgitation is not visualized. Mild aortic valve sclerosis is present, with no evidence of aortic valve stenosis.  5. The inferior vena cava is normal in size with greater than 50% respiratory variability, suggesting right atrial pressure of 3 mmHg. FINDINGS  Left Ventricle: Left ventricular ejection fraction, by estimation, is 60 to 65%. The left ventricle has normal function. The left ventricle has no regional wall motion abnormalities. The average left ventricular global longitudinal strain is -18.0 %. The left ventricular internal cavity size was normal in size. There is mild left ventricular hypertrophy. Left ventricular diastolic parameters are consistent with Grade I diastolic dysfunction (impaired relaxation). Right Ventricle: The right ventricular size is normal. No increase in right ventricular wall thickness. Right ventricular systolic function is normal. Left Atrium: Left atrial size was normal in size. Right Atrium: Right atrial size was normal in size. Pericardium: There is no evidence of pericardial effusion. Mitral Valve: The mitral valve is normal in structure. No evidence of mitral valve regurgitation. MV peak gradient, 3.2 mmHg. The mean mitral valve gradient is 1.0 mmHg. Tricuspid Valve: The tricuspid valve is normal in structure. Tricuspid valve regurgitation is trivial. Aortic Valve: The aortic valve is tricuspid. Aortic valve regurgitation is not visualized. Mild aortic valve sclerosis is present, with no evidence of aortic valve stenosis. Aortic valve mean gradient measures 6.0 mmHg. Aortic valve peak gradient measures 11.6 mmHg. Aortic valve area, by VTI measures 1.33 cm. Pulmonic Valve: The pulmonic valve was normal in structure. Pulmonic valve regurgitation is not visualized. Aorta: The aortic root is normal in size and  structure. Venous: The inferior vena cava is normal in size with greater than 50% respiratory variability, suggesting right atrial pressure of 3 mmHg. IAS/Shunts: No atrial level shunt detected by color flow Doppler.  LEFT VENTRICLE PLAX 2D LVIDd:         3.80 cm  Diastology LVIDs:         1.90 cm  LV e' medial:    6.96 cm/s LV PW:         1.10 cm  LV E/e' medial:  8.1 LV IVS:        1.10 cm  LV e' lateral:   9.03 cm/s LVOT diam:     1.80 cm  LV E/e' lateral: 6.3 LV SV:  41 LV SV Index:   21       2D Longitudinal Strain LVOT Area:     2.54 cm 2D Strain GLS Avg:     -18.0 %  RIGHT VENTRICLE RV Basal diam:  1.90 cm LEFT ATRIUM           Index       RIGHT ATRIUM          Index LA diam:      3.00 cm 1.58 cm/m  RA Area:     9.07 cm LA Vol (A2C): 16.5 ml 8.68 ml/m  RA Volume:   16.10 ml 8.47 ml/m LA Vol (A4C): 43.0 ml 22.61 ml/m  AORTIC VALVE                    PULMONIC VALVE AV Area (Vmax):    1.53 cm     PV Vmax:       1.51 m/s AV Area (Vmean):   1.35 cm     PV Vmean:      95.400 cm/s AV Area (VTI):     1.33 cm     PV VTI:        0.294 m AV Vmax:           170.00 cm/s  PV Peak grad:  9.1 mmHg AV Vmean:          118.000 cm/s PV Mean grad:  4.0 mmHg AV VTI:            0.306 m AV Peak Grad:      11.6 mmHg AV Mean Grad:      6.0 mmHg LVOT Vmax:         102.00 cm/s LVOT Vmean:        62.400 cm/s LVOT VTI:          0.160 m LVOT/AV VTI ratio: 0.52  AORTA Ao Root diam: 2.90 cm MITRAL VALVE MV Area (PHT): 2.84 cm    SHUNTS MV Area VTI:   1.70 cm    Systemic VTI:  0.16 m MV Peak grad:  3.2 mmHg    Systemic Diam: 1.80 cm MV Mean grad:  1.0 mmHg MV Vmax:       0.90 m/s MV Vmean:      53.2 cm/s MV Decel Time: 267 msec MV E velocity: 56.60 cm/s MV A velocity: 84.40 cm/s MV E/A ratio:  0.67 Debbe Odea MD Electronically signed by Debbe Odea MD Signature Date/Time: 09/03/2020/1:26:49 PM    Final      Labs:   Basic Metabolic Panel: Recent Labs  Lab 08/31/20 2302 09/01/20 2046 09/01/20 2046  09/02/20 0409 09/03/20 0813 09/04/20 0430  NA  --  135  --  135 138 139  K  --  2.8*   < > 3.5 3.4* 3.8  CL  --  97*  --  101 102 103  CO2  --  25  --  24 27 28   GLUCOSE  --  187*  --  117* 109* 120*  BUN  --  41*  --  38* 28* 21  CREATININE  --  2.04*  --  1.81* 1.46* 1.28*  CALCIUM  --  8.6*  --  8.7* 9.0 8.9  MG  --  2.4  --  2.7* 2.4  --   PHOS 2.8  --   --  3.4 2.2* 3.1   < > = values in this interval not displayed.   GFR Estimated Creatinine Clearance: 35 mL/min (  A) (by C-G formula based on SCr of 1.28 mg/dL (H)). Liver Function Tests: Recent Labs  Lab 09/01/20 2046 09/03/20 0813  AST 31  --   ALT 25  --   ALKPHOS 58  --   BILITOT 0.7  --   PROT 7.3  --   ALBUMIN 3.0* 3.0*   No results for input(s): LIPASE, AMYLASE in the last 168 hours. No results for input(s): AMMONIA in the last 168 hours. Coagulation profile No results for input(s): INR, PROTIME in the last 168 hours.  CBC: Recent Labs  Lab 09/01/20 2046 09/02/20 0409 09/03/20 0813  WBC 7.6 9.9 5.8  NEUTROABS 5.6  --  3.2  HGB 10.0* 9.9* 11.2*  HCT 32.1* 30.7* 34.5*  MCV 90.9 89.2 87.6  PLT 375 336 379   Cardiac Enzymes: No results for input(s): CKTOTAL, CKMB, CKMBINDEX, TROPONINI in the last 168 hours. BNP: Invalid input(s): POCBNP CBG: Recent Labs  Lab 09/03/20 2110 09/03/20 2351 09/04/20 0501 09/04/20 0809 09/04/20 1135  GLUCAP 116* 122* 112* 136* 132*   D-Dimer No results for input(s): DDIMER in the last 72 hours. Hgb A1c No results for input(s): HGBA1C in the last 72 hours. Lipid Profile No results for input(s): CHOL, HDL, LDLCALC, TRIG, CHOLHDL, LDLDIRECT in the last 72 hours. Thyroid function studies Recent Labs    09/02/20 0409  TSH 0.906   Anemia work up No results for input(s): VITAMINB12, FOLATE, FERRITIN, TIBC, IRON, RETICCTPCT in the last 72 hours. Microbiology Recent Results (from the past 240 hour(s))  Resp Panel by RT-PCR (Flu A&B, Covid) Nasopharyngeal Swab      Status: None   Collection Time: 09/01/20  8:46 PM   Specimen: Nasopharyngeal Swab; Nasopharyngeal(NP) swabs in vial transport medium  Result Value Ref Range Status   SARS Coronavirus 2 by RT PCR NEGATIVE NEGATIVE Final    Comment: (NOTE) SARS-CoV-2 target nucleic acids are NOT DETECTED.  The SARS-CoV-2 RNA is generally detectable in upper respiratory specimens during the acute phase of infection. The lowest concentration of SARS-CoV-2 viral copies this assay can detect is 138 copies/mL. A negative result does not preclude SARS-Cov-2 infection and should not be used as the sole basis for treatment or other patient management decisions. A negative result may occur with  improper specimen collection/handling, submission of specimen other than nasopharyngeal swab, presence of viral mutation(s) within the areas targeted by this assay, and inadequate number of viral copies(<138 copies/mL). A negative result must be combined with clinical observations, patient history, and epidemiological information. The expected result is Negative.  Fact Sheet for Patients:  BloggerCourse.com  Fact Sheet for Healthcare Providers:  SeriousBroker.it  This test is no t yet approved or cleared by the [place] FDA and  has been authorized for detection and/or diagnosis of SARS-CoV-2 by FDA under an Emergency Use Authorization (EUA). This EUA will remain  in effect (meaning this test can be used) for the duration of the COVID-19 declaration under Section 564(b)(1) of the Act, 21 U.S.C.section 360bbb-3(b)(1), unless the authorization is terminated  or revoked sooner.       Influenza A by PCR NEGATIVE NEGATIVE Final   Influenza B by PCR NEGATIVE NEGATIVE Final    Comment: (NOTE) The Xpert Xpress SARS-CoV-2/FLU/RSV plus assay is intended as an aid in the diagnosis of influenza from Nasopharyngeal swab specimens and should not be used as a sole basis  for treatment. Nasal washings and aspirates are unacceptable for Xpert Xpress SARS-CoV-2/FLU/RSV testing.  Fact Sheet for Patients: BloggerCourse.com  Fact Sheet for Healthcare Providers: SeriousBroker.it  This test is not yet approved or cleared by the [place] FDA and has been authorized for detection and/or diagnosis of SARS-CoV-2 by FDA under an Emergency Use Authorization (EUA). This EUA will remain in effect (meaning this test can be used) for the duration of the COVID-19 declaration under Section 564(b)(1) of the Act, 21 U.S.C. section 360bbb-3(b)(1), unless the authorization is terminated or revoked.  Performed at [facility], [address] [place], [place] [address]   Culture, blood (routine x 2)     Status: None (Preliminary result)   Collection Time: 09/01/20  8:46 PM   Specimen: BLOOD  Result Value Ref Range Status   Specimen Description BLOOD  RIGHT ARM  Final   Special Requests   Final    BOTTLES DRAWN AEROBIC AND ANAEROBIC Blood Culture adequate volume   Culture   Final    NO GROWTH 2 DAYS Performed at [facility], [address] [place], [place] [address]    Report Status PENDING  Incomplete  Culture, blood (routine x 2)     Status: None (Preliminary result)   Collection Time: 09/01/20  8:46 PM   Specimen: BLOOD  Result Value Ref Range Status   Specimen Description BLOOD LEFT ARM  Final   Special Requests   Final    BOTTLES DRAWN AEROBIC AND ANAEROBIC Blood Culture results may not be optimal due to an inadequate volume of blood received in culture bottles   Culture   Final    NO GROWTH 2 DAYS Performed at [facility], [address], [place], [place] [address]    Report Status PENDING  Incomplete  MRSA PCR Screening     Status: None   Collection Time: 09/02/20  2:47 AM   Specimen: Nasopharyngeal  Result Value Ref Range Status   MRSA by PCR NEGATIVE NEGATIVE Final     Comment:        The GeneXpert MRSA Assay (FDA approved for NASAL specimens only), is one component of a comprehensive MRSA colonization surveillance program. It is not intended to diagnose MRSA infection nor to guide or monitor treatment for MRSA infections. Performed at [facility], [address] [place], [place] [address]      Discharge Instructions:   Discharge Instructions    Diet - low sodium heart healthy   Complete by: As directed    Diet Carb Modified   Complete by: As directed    Discharge instructions   Complete by: As directed    Do not take Clonidine and Coreg at home   Face-to-face encounter (required for Medicare/Medicaid patients)   Complete by: As directed    I Azarel Banner certify that this patient is under my care and that I, or a nurse practitioner or physician's assistant working with me, had a face-to-face encounter that meets the physician face-to-face encounter requirements with this patient on 09/04/2020. The encounter with the patient was in whole, or in part for the following medical condition(s) which is the primary reason for home health care (List medical condition): Debility   The encounter with the patient was in whole, or in part, for the following medical condition, which is the primary reason for home health care: Debility   I certify that, based on my findings, the following services are medically necessary home health services: Physical therapy   Reason for Medically Necessary Home Health Services: Therapy- Investment banker, operational, Patent examiner   My  clinical findings support the need for the above services: Unsafe ambulation due to balance issues   Further, I certify that my clinical findings support that this patient is homebound due to: Unsafe ambulation due to balance issues   Home Health   Complete by: As directed    To provide the following care/treatments: PT   Increase activity slowly   Complete by: As  directed      Allergies as of 09/04/2020      Reactions   Lactose Intolerance (gi)    Penicillins Itching, Rash      Medication List    STOP taking these medications   carvedilol 12.5 MG tablet Commonly known as: COREG   IBU 800 MG tablet Generic drug: ibuprofen     TAKE these medications   amLODipine 10 MG tablet Commonly known as: NORVASC Take 1 tablet (10 mg total) by mouth daily. Start taking on: Sep 05, 2020   aspirin 81 MG EC tablet Take 81 mg by mouth daily.   atorvastatin 40 MG tablet Commonly known as: LIPITOR Take 40 mg by mouth daily.   brimonidine 0.2 % ophthalmic solution Commonly known as: ALPHAGAN Place 1 drop into both eyes 2 (two) times daily.   Calcium 600/Vitamin D 600-400 MG-UNIT Tabs Generic drug: Calcium Carbonate-Vitamin D3 Take 1 tablet by mouth 2 (two) times daily.   furosemide 20 MG tablet Commonly known as: LASIX Take 1 tablet (20 mg total) by mouth daily. Start taking on: Sep 05, 2020   gabapentin 100 MG capsule Commonly known as: NEURONTIN Take 100 mg by mouth 3 (three) times daily.   levothyroxine 50 MCG tablet Commonly known as: SYNTHROID Take 50 mcg by mouth daily.   lisinopril 20 MG tablet Commonly known as: ZESTRIL Take 2 tablets (40 mg total) by mouth daily. Start taking on: Sep 05, 2020   metFORMIN 500 MG tablet Commonly known as: GLUCOPHAGE Take 1 tablet by mouth 2 (two) times daily.   potassium chloride 10 MEQ CR capsule Commonly known as: MICRO-K Take 10 mEq by mouth daily.   tiZANidine 4 MG tablet Commonly known as: ZANAFLEX Take 4 mg by mouth 2 (two) times daily as needed.   Tradjenta 5 MG Tabs tablet Generic drug: linagliptin Take 5 mg by mouth daily.   Vitamin D-1000 Max St 25 MCG (1000 UT) tablet Generic drug: Cholecalciferol Take 1,000 Units by mouth daily.   Voltaren 1 % Gel Generic drug: diclofenac Sodium SMARTSIG:Sparingly Topical Twice Daily PRN         Time coordinating discharge:  34 minutes  Signed:  Sani Loiseau  [facility] 09/04/2020, 2:04 PM   Pager on [email or web address]. If 7PM-7AM, please contact night-coverage at [email or web address]

## 2020-09-04 NOTE — Progress Notes (Signed)
IV catheter removed, pt stable. Went over discharge instructions with pt and granddaughter. Both verbalized understanding. Pt transported via W/C to private car.

## 2020-09-04 NOTE — Plan of Care (Signed)

## 2020-09-04 NOTE — Consult Note (Signed)
Consultation Note Date: 09/04/2020   Patient Name: Allison Rogers  DOB: 08-Jan-1935  MRN: [id number]  Age / Sex: 86 y.o., female   PCP: Marguerita Merles, MD Referring Physician: Jennye Boroughs, MD   REASON FOR CONSULTATION:Establishing goals of care  Palliative Care consult requested for goals of care discussion in this 86 y.o. female with a medical history significant for pretension, hyperlipidemia, diabetes, and hypothyroidism.  Patient presented to the ED from home via EMS with family concerns for weakness and altered mental status.  Patient was found to be bradycardic by EMS (40s) and was administered atropine with mild improvement.  On work-up patient found to be bradycardic 54, hypotensive (59/40).  Since admission patient receiving treatment for hypovolemic shock, sinus bradycardia, and hypertension.  Clinical Assessment and Goals of Care: I have reviewed medical records including lab results, imaging, Epic notes, and MAR, received report from the bedside RN, and assessed the patient.   I met at the bedside with Allison Rogers to discuss diagnosis prognosis, GOC, EOL wishes, disposition and options.  Patient is awake, alert and oriented x3.  Denies pain or shortness of breath.  I introduced Palliative Medicine as specialized medical care for people living with serious illness. It focuses on providing relief from the symptoms and stress of a serious illness. The goal is to improve quality of life for both the patient and the family.  We discussed a brief life review of the patient, along with her functional and nutritional status.  Patient reports she has 10 children and 24 grandchildren.  She retired from housekeeping.  States she currently lives alone however her daughters are very active in her care and are generally at her home daily for multiple hours throughout the day.  She enjoys spending time with her family.  Prior to admission patient reports ability to perform most ADLs  independently.  She uses a cane for ambulatory assistance.  States she no longer drives and her family generally prepares meals or bring items that she needs.  Patient had a period of silence and observed pulling the covers over her head.  When I asked patient if she was okay she began sobbing and stating she wanted to go home and did not like being in the hospital.  Is tearful and expressing she has lost her husband over the past year and most recently her daughter a little over a month ago.  Emotional support provided and space and opportunity created for Allison Rogers to express her feelings.  We discussed Her current illness and what it means in the larger context of Her on-going co-morbidities. Natural disease trajectory and expectations at EOL were discussed.  Patient verbalized her understanding of her current illness and ongoing comorbidities.  She states she is remaining hopeful for improvement with her main focus being to return home and be amongst her family.  A detailed discussion was had today regarding advanced directives.  Concepts specific to code status, artifical feeding and hydration, continued IV antibiotics and rehospitalization.  Patient states she does not have an advanced directive and is not interested in completing one. She reports her children have a close relationship and will make the best decisions based on the situation collectively.   We discussed at length patient's partial code status with all heroic measures except intubation.  Education provided.  Patient verbalizes understanding and confirms her request to remain a partial code sharing of all efforts up until intubation is not successful then her family would know  to allow her to pass away at that point.   Values and goals of care important to patient and family were attempted to be elicited.    I discussed the importance of continued conversation with family and their medical providers regarding overall plan of care  and treatment options, ensuring decisions are within the context of the patients values and GOCs.   Palliative Care services outpatient were explained and offered. Patient  verbalized understanding and awareness of palliative's goals and philosophy of care.   Questions and concerns were addressed.  Allison Rogers was encouraged to call with questions or concerns.  PMT will continue to support holistically as needed.   CODE STATUS: Limited code  NO INTUBATION  ADVANCE DIRECTIVES: Primary Decision Maker: Patient  SYMPTOM MANAGEMENT: Per attending  Palliative Prophylaxis:   Emotional support  PSYCHO-SOCIAL/SPIRITUAL:  Support System: Family  Desire for further Chaplaincy support: Actively supporting  Additional Recommendations (Limitations, Scope, Preferences):  No intubation  Education on hospice/palliative    PAST MEDICAL HISTORY: Past Medical History:  Diagnosis Date  . Hyperlipidemia   . Hypertension   . Hypothyroidism   . Type 2 diabetes mellitus (HCC)     ALLERGIES:  is allergic to lactose intolerance (gi) and penicillins.   MEDICATIONS:  Current Facility-Administered Medications  Medication Dose Route Frequency Provider Last Rate Last Admin  . 0.9 %  sodium chloride infusion  250 mL Intravenous Continuous Blake Divine, MD 20 mL/hr at 09/02/20 1020 Restarted at 09/02/20 1020  . acetaminophen (TYLENOL) tablet 650 mg  650 mg Oral Q4H PRN Mansy, Jan A, MD   650 mg at 09/03/20 2320  . amLODipine (NORVASC) tablet 5 mg  5 mg Oral Daily Jennye Boroughs, MD   5 mg at 09/04/20 0956  . atorvastatin (LIPITOR) tablet 40 mg  40 mg Oral Daily Rust-Chester, Britton L, NP   40 mg at 09/04/20 0956  . brimonidine (ALPHAGAN) 0.2 % ophthalmic solution 1 drop  1 drop Both Eyes BID Rust-Chester, Britton L, NP   1 drop at 09/04/20 0957  . Chlorhexidine Gluconate Cloth 2 % PADS 6 each  6 each Topical Q0600 Rust-Chester, Huel Cote, NP   6 each at 09/02/20 [phone]  . docusate sodium (COLACE)  capsule 100 mg  100 mg Oral BID PRN Rust-Chester, Britton L, NP      . feeding supplement (ENSURE ENLIVE / ENSURE PLUS) liquid 237 mL  237 mL Oral BID BM Kasa, Kurian, MD   237 mL at 09/03/20 1009  . heparin injection 5,000 Units  5,000 Units Subcutaneous Q8H Rust-Chester, Britton L, NP   5,000 Units at 09/04/20 0535  . hydrALAZINE (APRESOLINE) injection 10 mg  10 mg Intravenous Q6H PRN Jennye Boroughs, MD   10 mg at 09/03/20 2110  . hydrochlorothiazide (MICROZIDE) capsule 12.5 mg  12.5 mg Oral Daily Jennye Boroughs, MD   12.5 mg at 09/04/20 1004  . levothyroxine (SYNTHROID) tablet 50 mcg  50 mcg Oral Daily Rust-Chester, Britton L, NP   50 mcg at 09/04/20 0956  . melatonin tablet 5 mg  5 mg Oral QHS PRN Mansy, Jan A, MD   5 mg at 09/03/20 2320  . multivitamin with minerals tablet 1 tablet  1 tablet Oral Daily Flora Lipps, MD   1 tablet at 09/04/20 0955  . pantoprazole (PROTONIX) EC tablet 40 mg  40 mg Oral Q1200 Flora Lipps, MD   40 mg at 09/03/20 1143  . phosphorus (K PHOS NEUTRAL) tablet 250 mg  250 mg Oral  TID Jennye Boroughs, MD   250 mg at 09/04/20 0956  . polyethylene glycol (MIRALAX / GLYCOLAX) packet 17 g  17 g Oral Daily PRN Rust-Chester, Britton L, NP        VITAL SIGNS: BP (!) 192/80 (BP Location: Left Arm)   Pulse 71   Temp 98.9 F (37.2 C) (Oral)   Resp 20   Ht 5' 6"  (1.676 m)   Wt 80.6 kg   SpO2 100%   BMI 28.68 kg/m  Filed Weights   09/01/20 2039 09/02/20 0240 09/03/20 0755  Weight: 72.6 kg 72.6 kg 80.6 kg    Estimated body mass index is 28.68 kg/m as calculated from the following:   Height as of this encounter: 5' 6"  (1.676 m).   Weight as of this encounter: 80.6 kg.  LABS: CBC:    Component Value Date/Time   WBC 5.8 09/03/2020 0813   HGB 11.2 (L) 09/03/2020 0813   HCT 34.5 (L) 09/03/2020 0813   PLT 379 09/03/2020 0813   Comprehensive Metabolic Panel:    Component Value Date/Time   NA 139 09/04/2020 0430   K 3.8 09/04/2020 0430   BUN 21 09/04/2020 0430    CREATININE 1.28 (H) 09/04/2020 0430   ALBUMIN 3.0 (L) 09/03/2020 0813     Review of Systems  All other systems reviewed and are negative. Unless otherwise noted, a complete review of systems is negative.  Physical Exam General: NAD Cardiovascular: regular rate and rhythm Pulmonary: clear ant fields, diminished bilaterally  Abdomen: soft, nontender, + bowel sounds Extremities: no edema, no joint deformities Skin: no rashes, warm and dry Neurological: AAOx3, tearful   Prognosis: Guarded  Discharge Planning:  Home with Palliative Services  Recommendations: . Partial code-(NO INTUBATION) as confirmed by patient . Continue with current plan of care  . Patient is clear in her expressed wishes to continue to treat the treatable.  She remains hopeful for some improvement with her ultimate goal of returning home with family and friends. . Patient is tearful expressing the recent loss of her husband over the past year and daughter (1 month ago).  States she does not like being in the hospital and is hopeful she will get to go home soon. . Education provided and patient verbalized agreement for outpatient palliative support.  (TOC referral) . PMT will continue to support and follow as needed. Please call team line with urgent needs.   Palliative Performance Scale: PPS 30%              Patient expressed understanding and was in agreement with this plan.   Thank you for allowing the Palliative Medicine Team to assist in the care of this patient. Please utilize secure chat with additional questions, if there is no response within 30 minutes please call the above phone number.   Time In: 1015 Time Out: 1105 Time Total: 50 min.   Visit consisted of counseling and education dealing with the complex and emotionally intense issues of symptom management and palliative care in the setting of serious and potentially life-threatening illness.Greater than 50%  of this time was spent counseling  and coordinating care related to the above assessment and plan.  Signed by:  Alda Lea, AGPCNP-BC Palliative Medicine Team  Phone: [phone] Pager: [phone] Amion: [place] Team providers are available by phone from 7am to 7pm daily and can be reached through the team cell phone.  Should this patient require assistance outside of these hours, please call the  patient's attending physician.

## 2020-09-06 LAB — CULTURE, BLOOD (ROUTINE X 2)
Culture: NO GROWTH
Culture: NO GROWTH
Special Requests: ADEQUATE

## 2021-03-18 ENCOUNTER — Other Ambulatory Visit: Payer: Self-pay | Admitting: Family Medicine

## 2021-03-18 DIAGNOSIS — Z1231 Encounter for screening mammogram for malignant neoplasm of breast: Secondary | ICD-10-CM

## 2021-03-24 ENCOUNTER — Other Ambulatory Visit: Payer: Self-pay | Admitting: Family Medicine

## 2021-03-24 DIAGNOSIS — Z1231 Encounter for screening mammogram for malignant neoplasm of breast: Secondary | ICD-10-CM

## 2021-05-20 ENCOUNTER — Other Ambulatory Visit: Payer: Medicare Other

## 2021-05-26 ENCOUNTER — Ambulatory Visit
Admission: RE | Admit: 2021-05-26 | Discharge: 2021-05-26 | Disposition: A | Discharge status: Home / Self Care | Payer: Medicare Other | Source: Ambulatory Visit | Attending: Family Medicine | Admitting: Family Medicine

## 2021-05-26 ENCOUNTER — Other Ambulatory Visit: Payer: Self-pay

## 2021-05-26 DIAGNOSIS — Z780 Asymptomatic menopausal state: Secondary | ICD-10-CM | POA: Diagnosis not present

## 2021-05-26 DIAGNOSIS — M85851 Other specified disorders of bone density and structure, right thigh: Secondary | ICD-10-CM | POA: Insufficient documentation

## 2021-05-26 DIAGNOSIS — Z1231 Encounter for screening mammogram for malignant neoplasm of breast: Secondary | ICD-10-CM

## 2021-05-26 DIAGNOSIS — Z13820 Encounter for screening for osteoporosis: Secondary | ICD-10-CM | POA: Insufficient documentation

## 2021-05-26 DIAGNOSIS — E039 Hypothyroidism, unspecified: Secondary | ICD-10-CM | POA: Insufficient documentation

## 2021-10-28 ENCOUNTER — Other Ambulatory Visit: Payer: Self-pay

## 2021-10-28 ENCOUNTER — Encounter: Payer: Self-pay | Admitting: Emergency Medicine

## 2021-10-28 ENCOUNTER — Emergency Department
Admission: EM | Admit: 2021-10-28 | Discharge: 2021-10-28 | Disposition: A | Discharge status: Home / Self Care | Payer: Medicare Other | Attending: Emergency Medicine | Admitting: Emergency Medicine

## 2021-10-28 ENCOUNTER — Emergency Department: Payer: Medicare Other

## 2021-10-28 DIAGNOSIS — E039 Hypothyroidism, unspecified: Secondary | ICD-10-CM | POA: Diagnosis not present

## 2021-10-28 DIAGNOSIS — M1712 Unilateral primary osteoarthritis, left knee: Secondary | ICD-10-CM | POA: Diagnosis not present

## 2021-10-28 DIAGNOSIS — E119 Type 2 diabetes mellitus without complications: Secondary | ICD-10-CM | POA: Diagnosis not present

## 2021-10-28 DIAGNOSIS — M25569 Pain in unspecified knee: Secondary | ICD-10-CM | POA: Diagnosis present

## 2021-10-28 MED ORDER — PREDNISONE 10 MG PO TABS: 40.0000 mg | ORAL_TABLET | Freq: Every day | ORAL | 0 refills | Status: AC

## 2021-10-28 MED ORDER — ACETAMINOPHEN 325 MG PO TABS
650.0000 mg | ORAL_TABLET | Freq: Once | ORAL | Status: AC
  Administered 2021-10-28: 650 mg via ORAL
  Filled 2021-10-28: qty 2

## 2021-10-28 MED ORDER — LIDOCAINE 5 % EX PTCH: 1.0000 | MEDICATED_PATCH | Freq: Two times a day (BID) | CUTANEOUS | 0 refills | Status: AC

## 2021-10-28 MED ORDER — DEXAMETHASONE SODIUM PHOSPHATE 10 MG/ML IJ SOLN
8.0000 mg | Freq: Once | INTRAMUSCULAR | Status: AC
  Administered 2021-10-28: 8 mg via INTRAVENOUS
  Filled 2021-10-28: qty 1

## 2021-10-28 MED ORDER — LIDOCAINE 5 % EX PTCH
1.0000 | MEDICATED_PATCH | CUTANEOUS | Status: DC
  Administered 2021-10-28: 1 via TRANSDERMAL
  Filled 2021-10-28: qty 1

## 2021-10-28 NOTE — ED Provider Triage Note (Signed)
Emergency Medicine Provider Triage Evaluation Note  Allison Rogers, a 87 y.o. female  was evaluated in triage.  Pt complains of left knee pain.  Patient presents from home via EMS, with complaints of left knee pain with some giveaway.  She denies any frank injury or fall.  Patient with history of hypertension, diabetes, and hypothyroidism, presents with left knee pain and swelling.  Review of Systems  Positive: Left knee pain/swelling Negative: FCS  Physical Exam  There were no vitals taken for this visit. Gen:   Awake, no distress  NAD Resp:  Normal effort  MSK:   Moves extremities without difficulty. Bilateral knee edema appreciated.  Patient with left knee pain on exam. Other:    Medical Decision Making  Medically screening exam initiated at 5:32 PM.  Appropriate orders placed.  Allison Rogers was informed that the remainder of the evaluation will be completed by another provider, this initial triage assessment does not replace that evaluation, and the importance of remaining in the ED until their evaluation is complete.  Geriatric patient to the ED for evaluation of left knee pain x2 to 3 days without preceding injury or trauma.  Patient presents with swelling and disability to the left knee.   Allison Hoard, PA-C 10/28/21 1734

## 2021-10-28 NOTE — Discharge Instructions (Signed)
Please follow-up with orthopedics.  Please return for any new, worsening, or change in symptoms or other concerns.  You may take the medications as prescribed.

## 2021-10-28 NOTE — ED Notes (Signed)
Patient and family verbalized discharge and followup understanding

## 2021-10-28 NOTE — ED Triage Notes (Signed)
Arrives via [facility] from home.  C/O left knee pain x 2-3 days.  No injury. No trauma.   VS wnl.  CBG:  179

## 2021-10-28 NOTE — ED Provider Notes (Signed)
[facility] Provider Note    Event Date/Time   First MD Initiated Contact with Patient 10/28/21 1915     (approximate)   History   Knee Pain   HPI  Allison Rogers is a 87 y.o. female with a past medical history of diabetes, hypothyroidism who presents today for evaluation of knee pain.  Patient reports that she is known bone-on-bone arthritis and has an appointment scheduled next week with orthopedics.  She reports that her pain has been ongoing this week in the front of her knee, and it is causing her to have difficulty ambulating.  She denies any fevers or chills.  She has not had any injuries.  She has not taken anything for her pain.  She denies any calf pain or leg swelling.  She denies history of PE or DVT.  She has not had any hip pain.  She has not noticed any swelling, warmth, or redness.  Patient Active Problem List   Diagnosis Date Noted   Bradycardia 09/02/2020   Hypothyroidism 09/02/2020   Type 2 diabetes mellitus (HCC) 09/02/2020   Hypovolemic shock (HCC) 09/02/2020          Physical Exam   Triage Vital Signs: ED Triage Vitals  Enc Vitals Group     BP 10/28/21 1818 (!) 101/55     Pulse Rate 10/28/21 1818 62     Resp 10/28/21 1818 20     Temp 10/28/21 1818 98.1 F (36.7 C)     Temp Source 10/28/21 1818 Oral     SpO2 10/28/21 1818 98 %     Weight 10/28/21 1734 177 lb 11.1 oz (80.6 kg)     Height 10/28/21 1734 5\' 6"  (1.676 m)     Head Circumference --      Peak Flow --      Pain Score 10/28/21 1734 7     Pain Loc --      Pain Edu? --      Excl. in [place]? --     Most recent vital signs: Vitals:   10/28/21 1818 10/28/21 1941  BP: (!) 101/55 138/63  Pulse: 62 62  Resp: 20 17  Temp: 98.1 F (36.7 C)   SpO2: 98% 95%    Physical Exam Vitals and nursing note reviewed.  Constitutional:      General: Awake and alert. No acute distress.    Appearance: Normal appearance. The patient is normal weight.  HENT:     Head:  Normocephalic and atraumatic.     Mouth: Mucous membranes are moist.  Eyes:     General: PERRL. Normal EOMs        Right eye: No discharge.        Left eye: No discharge.     Conjunctiva/sclera: Conjunctivae normal.  Cardiovascular:     Rate and Rhythm: Normal rate and regular rhythm.     Pulses: Normal pulses.     Heart sounds: Normal heart sounds Pulmonary:     Effort: Pulmonary effort is normal. No respiratory distress.     Breath sounds: Normal breath sounds.  Abdominal:     Abdomen is soft. There is no abdominal tenderness. No rebound or guarding.  Musculoskeletal:        General: No swelling. Normal range of motion.     Cervical back: Normal range of motion and neck supple.  Left knee: No deformity or rash. No joint line tenderness. No patellar tenderness, no ballotment Warm and well perfused extremity with  2+ pedal pulses 5/5 strength to dorsiflexion and plantarflexion at the ankle with intact sensation throughout extremity Normal range of motion of the knee, with intact flexion and extension to active and passive range of motion. Extensor mechanism intact. No ligamentous laxity. Negative anterior/posterior drawer/negative lachman, negative mcmurrays No effusion or warmth Intact quadriceps, hamstring function, patellar tendon function Pelvis stable Full ROM of ankle without pain or swelling Foot warm and well perfused Skin:    General: Skin is warm and dry.     Capillary Refill: Capillary refill takes less than 2 seconds.     Findings: No rash.  Neurological:     Mental Status: The patient is awake and alert.      ED Results / Procedures / Treatments   Labs (all labs ordered are listed, but only abnormal results are displayed) Labs Reviewed - No data to display   EKG     RADIOLOGY I independently reviewed and interpreted imaging and agree with radiologists findings.     PROCEDURES:  Critical Care performed:   Procedures   MEDICATIONS ORDERED IN  ED: Medications  lidocaine (LIDODERM) 5 % 1 patch (1 patch Transdermal Patch Applied 10/28/21 1935)  dexamethasone (DECADRON) injection 8 mg (8 mg Intravenous Given 10/28/21 1934)  acetaminophen (TYLENOL) tablet 650 mg (650 mg Oral Given 10/28/21 1935)     IMPRESSION / MDM / ASSESSMENT AND PLAN / ED COURSE  I reviewed the triage vital signs and the nursing notes.   Differential diagnosis includes, but is not limited to, osteoarthritis effusion, sprain, contusion, dislocation, fracture, joint infection, tendon rupture. No evidence of neurological deficit or vascular compromise on exam. No fracture/dislocation on X-Ray.  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  No deformity or obvious ligamentous laxity on exam. No constitutional symptoms or warmth/effusion to suggest septic joint. No history of immunosuppression. Overall well appearing, vital signs stable. No indication for diagnostic or therapeutic procedure such as arthrocentesis.  No lower extremity swelling or pitting edema, legs are equal in circumference bilaterally, do not suspect that she has a DVT.  Patient already has follow-up scheduled with Dr. Rosita Kea.  I advised that she keep this appointment.  She was treated symptomatically in the emergency department with Decadron, Tylenol, and Lidoderm patch with significant improvement of her symptoms.  Patient was prescribed a short course of steroids for her pain.  Return precautions and care instructions discussed. Outpatient follow-up advised. Patient agrees with plan of care.   Patient's presentation is most consistent with acute complicated illness / injury requiring diagnostic workup.   Clinical Course as of 10/28/21 2120  Wed Oct 28, 2021  2053 Patient reports she feels improved and ready for discharge [JP]    Clinical Course User Index [JP] Hawk Mones, Herb Grays, PA-C     FINAL CLINICAL IMPRESSION(S) / ED DIAGNOSES   Final diagnoses:   Osteoarthritis of left knee, unspecified osteoarthritis type     Rx / DC Orders   ED Discharge Orders          Ordered    predniSONE (DELTASONE) 10 MG tablet  Daily        10/28/21 2055    lidocaine (LIDODERM) 5 %  Every 12 hours        10/28/21 2055             Note:  This document was prepared using Dragon voice recognition software and may include unintentional dictation errors.   Jackelyn Hoehn, PA-C 10/28/21 2121  Chesley Noon, MD 10/29/21 1105

## 2021-11-05 DIAGNOSIS — M5126 Other intervertebral disc displacement, lumbar region: Secondary | ICD-10-CM | POA: Insufficient documentation

## 2022-02-13 ENCOUNTER — Other Ambulatory Visit: Payer: Self-pay

## 2022-02-13 ENCOUNTER — Emergency Department
Admission: EM | Admit: 2022-02-13 | Discharge: 2022-02-13 | Disposition: A | Discharge status: Home / Self Care | Payer: Medicare Other | Attending: Emergency Medicine | Admitting: Emergency Medicine

## 2022-02-13 DIAGNOSIS — E119 Type 2 diabetes mellitus without complications: Secondary | ICD-10-CM | POA: Diagnosis not present

## 2022-02-13 DIAGNOSIS — R55 Syncope and collapse: Secondary | ICD-10-CM | POA: Diagnosis present

## 2022-02-13 DIAGNOSIS — I10 Essential (primary) hypertension: Secondary | ICD-10-CM | POA: Insufficient documentation

## 2022-02-13 DIAGNOSIS — E039 Hypothyroidism, unspecified: Secondary | ICD-10-CM | POA: Insufficient documentation

## 2022-02-13 LAB — CBC WITH DIFFERENTIAL/PLATELET
Abs Immature Granulocytes: 0.02 10*3/uL (ref 0.00–0.07)
Basophils Absolute: 0 10*3/uL (ref 0.0–0.1)
Basophils Relative: 1 %
Eosinophils Absolute: 0.1 10*3/uL (ref 0.0–0.5)
Eosinophils Relative: 2 %
HCT: 38.5 % (ref 36.0–46.0)
Hemoglobin: 12.3 g/dL (ref 12.0–15.0)
Immature Granulocytes: 0 %
Lymphocytes Relative: 29 %
Lymphs Abs: 1.5 10*3/uL (ref 0.7–4.0)
MCH: 28.5 pg (ref 26.0–34.0)
MCHC: 31.9 g/dL (ref 30.0–36.0)
MCV: 89.3 fL (ref 80.0–100.0)
Monocytes Absolute: 0.4 10*3/uL (ref 0.1–1.0)
Monocytes Relative: 8 %
Neutro Abs: 3.1 10*3/uL (ref 1.7–7.7)
Neutrophils Relative %: 60 %
Platelets: 208 10*3/uL (ref 150–400)
RBC: 4.31 MIL/uL (ref 3.87–5.11)
RDW: 14 % (ref 11.5–15.5)
WBC: 5.1 10*3/uL (ref 4.0–10.5)
nRBC: 0 % (ref 0.0–0.2)

## 2022-02-13 LAB — COMPREHENSIVE METABOLIC PANEL
ALT: 13 U/L (ref 0–44)
AST: 22 U/L (ref 15–41)
Albumin: 3.5 g/dL (ref 3.5–5.0)
Alkaline Phosphatase: 65 U/L (ref 38–126)
Anion gap: 5 (ref 5–15)
BUN: 20 mg/dL (ref 8–23)
CO2: 27 mmol/L (ref 22–32)
Calcium: 8.9 mg/dL (ref 8.9–10.3)
Chloride: 106 mmol/L (ref 98–111)
Creatinine, Ser: 1.42 mg/dL — ABNORMAL HIGH (ref 0.44–1.00)
GFR, Estimated: 36 mL/min — ABNORMAL LOW (ref >60)
Glucose, Bld: 257 mg/dL — ABNORMAL HIGH (ref 70–99)
Potassium: 4 mmol/L (ref 3.5–5.1)
Sodium: 138 mmol/L (ref 135–145)
Total Bilirubin: 0.7 mg/dL (ref 0.3–1.2)
Total Protein: 6.8 g/dL (ref 6.5–8.1)

## 2022-02-13 LAB — TROPONIN I (HIGH SENSITIVITY)
Troponin I (High Sensitivity): 19 ng/L — ABNORMAL HIGH (ref <18)
Troponin I (High Sensitivity): 14 ng/L (ref <18)

## 2022-02-13 NOTE — ED Notes (Addendum)
D/C and new reasons to return discussed with pt. Daughter with pt on D/C. NAD noted. F/up discussed with pt.

## 2022-02-13 NOTE — ED Provider Notes (Signed)
[facility] Provider Note    Event Date/Time   First MD Initiated Contact with Patient 02/13/22 1137     (approximate)   History   Loss of Consciousness   HPI  Allison Rogers is a 87 y.o. female past medical history of bradycardia, hypothyroidism, type 2 diabetes who presents after syncopal episode.  Patient was getting her hair done by her family member.  She does not remember any event but apparently per EMS family said her eyes rolled back and she was unconscious for several minutes then returned to baseline.  Was not confused afterward apparently there was no shaking.  Patient just remembers the ambulance then coming.  She denies any preceding chest pain shortness of breath or palpitations.  Currently she feels fine denies any recent illnesses fevers decreased p.o. intake etc.     Past Medical History:  Diagnosis Date   Hyperlipidemia    Hypertension    Hypothyroidism    Type 2 diabetes mellitus ([place])     Patient Active Problem List   Diagnosis Date Noted   Bradycardia 09/02/2020   Hypothyroidism 09/02/2020   Type 2 diabetes mellitus ([place]) 09/02/2020   Hypovolemic shock ([place]) 09/02/2020     Physical Exam  Triage Vital Signs: ED Triage Vitals  Enc Vitals Group     BP 02/13/22 1141 (!) 167/71     Pulse Rate 02/13/22 1141 65     Resp 02/13/22 1141 18     Temp --      Temp src --      SpO2 02/13/22 1141 97 %     Weight --      Height --      Head Circumference --      Peak Flow --      Pain Score 02/13/22 1142 0     Pain Loc --      Pain Edu? --      Excl. in [place]? --     Most recent vital signs: Vitals:   02/13/22 1200 02/13/22 1447  BP: (!) 162/73   Pulse: (!) 58   Resp: 17   Temp:  97.6 F (36.4 C)  SpO2: 99%      General: Awake, no distress.  CV:  Good peripheral perfusion. No pitting edema `  Systolic ejection murmur Resp:  Normal effort. No increased wob Abd:  No distention. Soft, nontender Neuro:              Awake, Alert, Oriented x 3  Other:     ED Results / Procedures / Treatments  Labs (all labs ordered are listed, but only abnormal results are displayed) Labs Reviewed  COMPREHENSIVE METABOLIC PANEL - Abnormal; Notable for the following components:      Result Value   Glucose, Bld 257 (*)    Creatinine, Ser 1.42 (*)    GFR, Estimated 36 (*)    All other components within normal limits  TROPONIN I (HIGH SENSITIVITY) - Abnormal; Notable for the following components:   Troponin I (High Sensitivity) 19 (*)    All other components within normal limits  CBC WITH DIFFERENTIAL/PLATELET  TROPONIN I (HIGH SENSITIVITY)     EKG  EKG reviewed and interpreted by myself shows normal sinus rhythm with normal axis first-degree AV block, T wave inversions in the inferior leads lead I V4 through V6   RADIOLOGY    PROCEDURES:  Critical Care performed: No  Procedures  The patient is on the cardiac monitor to  evaluate for evidence of arrhythmia and/or significant heart rate changes.   MEDICATIONS ORDERED IN ED: Medications - No data to display   IMPRESSION / MDM / ASSESSMENT AND PLAN / ED COURSE  I reviewed the triage vital signs and the nursing notes.                              Patient's presentation is most consistent with acute presentation with potential threat to life or bodily function.  Differential diagnosis includes, but is not limited to, syncope secondary to cardiac rhythm disturbance such as bradycardia or heart block, valvular heart disease, vasovagal episode  The patient is an 87 year old female who presents because of a syncopal episode.  Patient does not really recall the event she was sitting in a chair getting her hair done and then apparently her eyes rolled back and she syncopized.  Did not fall to the ground or have any loss of postural tone.  There was no shaking no postictal period.  Patient now feels fine she denies any preceding shortness of breath chest pain  no recent illnesses fevers decreased p.o. intake etc.  She is mildly hypertensive vitals are otherwise reassuring.  EKG showing normal sinus rhythm with diffuse T wave inversions not very far off of her prior EKG although there is new inversion in lead V4 and some inversion in lead I as well which were not present before.  Last echo was from last year without significant valvular abnormality.  I do hear a cardiac murmur systolic ejection murmur.  We will check CBC and CMP.  If patient feeling okay able to ambulate and lab work is not significantly abnormal may be able to follow-up with cardiology as an outpatient.  Patient's CBC and CMP are reassuring.  Creatinine is near baseline.  First troponin is 19 and repeat is 14.  Patient continues to be asymptomatic.  Will discharge and refer to cardiology as an outpatient to have echo.  We discussed return to ED for recurrent syncope or any new cardiopulmonary symptoms.  Patient is appropriate for discharge.  Her daughter and her both understand the plan.      FINAL CLINICAL IMPRESSION(S) / ED DIAGNOSES   Final diagnoses:  Syncope, unspecified syncope type     Rx / DC Orders   ED Discharge Orders          Ordered    Ambulatory referral to Cardiology       Comments: If you have not heard from the Cardiology office within the next 72 hours please call [phone].   02/13/22 1448             Note:  This document was prepared using Dragon voice recognition software and may include unintentional dictation errors.   Georga Hacking, MD 02/13/22 1450

## 2022-02-13 NOTE — ED Triage Notes (Signed)
Pt presents to ED with c/o of having a syncopal episode PTA. Pt was getting her hair done when this happened. EMS states pt was "out for several minutes". EMS denies any periods of confusion after syncopal episode. Pt denies any recent illness. Pt denies SOB or CP. Pt denies any complains at this time.   Pt is currently A&Ox4 NAD noted.

## 2022-02-13 NOTE — Discharge Instructions (Signed)
Your blood work and EKG were reassuring today.  I did hear a heart murmur today.  I would like you to see a cardiologist to have an ultrasound of your heart and be evaluated for why you passed out today.  If you have another episode of passing out or you develop chest pain or shortness of breath please return to the emergency department.

## 2022-08-06 IMAGING — MG MM DIGITAL SCREENING BILAT W/ TOMO AND CAD
6 of 12 series · 6 of 36 positions shown · non-contrast
Comparison: Previous exam(s).

CLINICAL DATA: Screening.

EXAM:
DIGITAL SCREENING BILATERAL MAMMOGRAM WITH TOMOSYNTHESIS AND CAD
TECHNIQUE: Bilateral screening digital craniocaudal and mediolateral oblique
mammograms were obtained. Bilateral screening digital breast
tomosynthesis was performed. The images were evaluated with
computer-aided detection.

[L CC synth-2D]
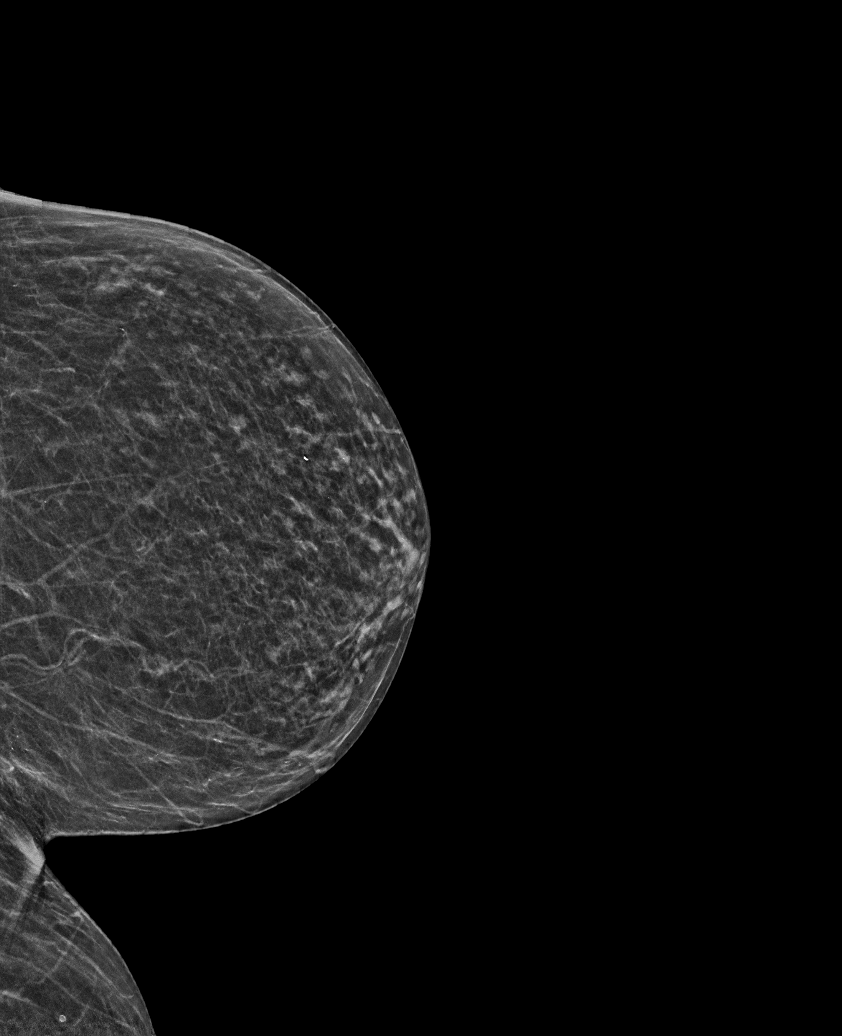

[R CC synth-2D]
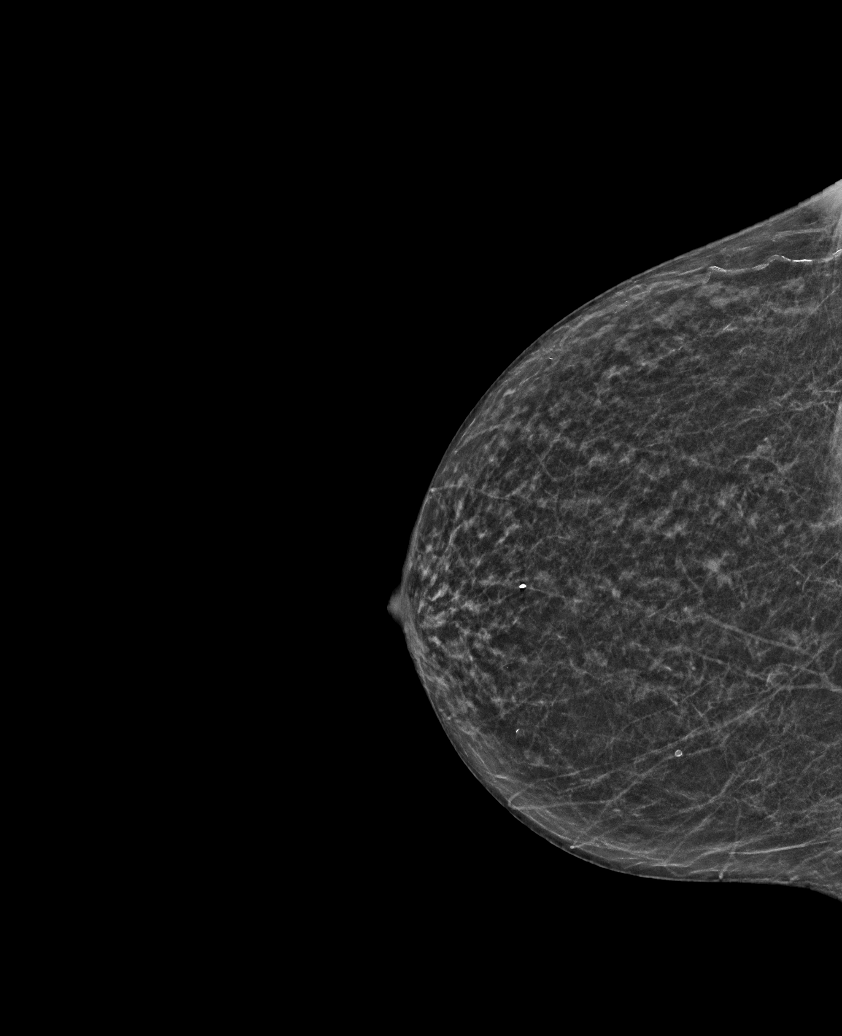

[L MLO synth-2D (1 of 2)]
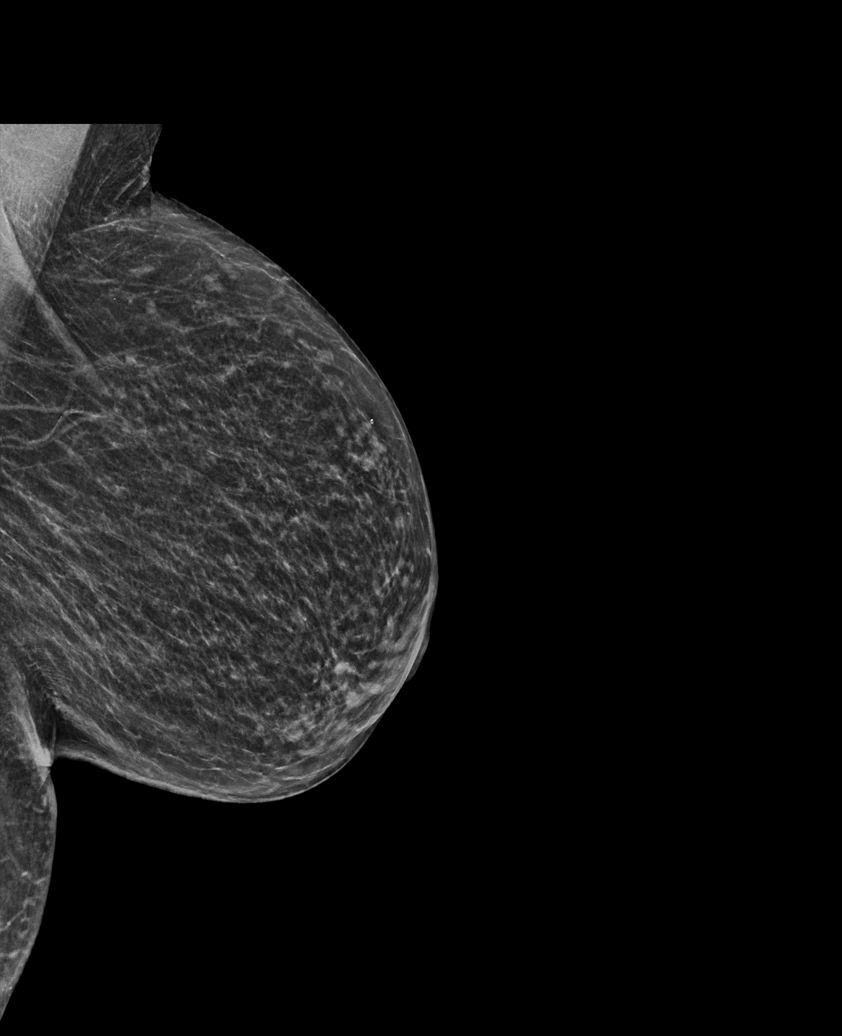

[R MLO synth-2D (1 of 2)]
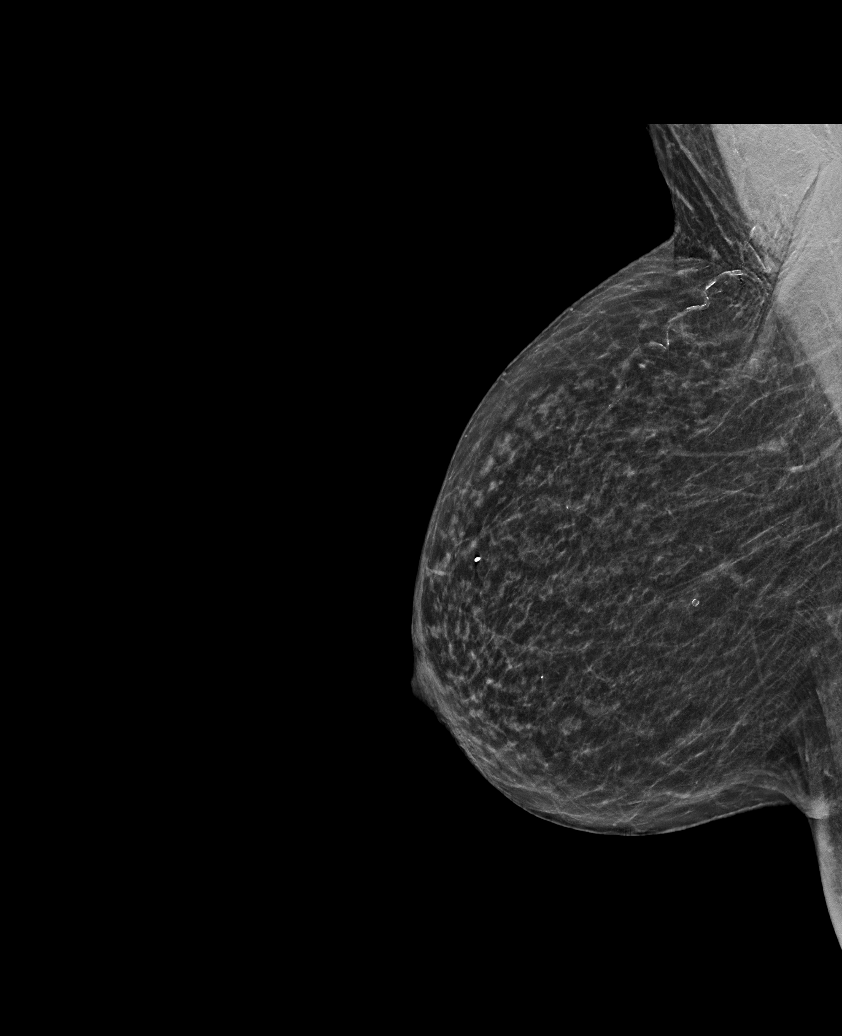

[R MLO synth-2D (2 of 2)]
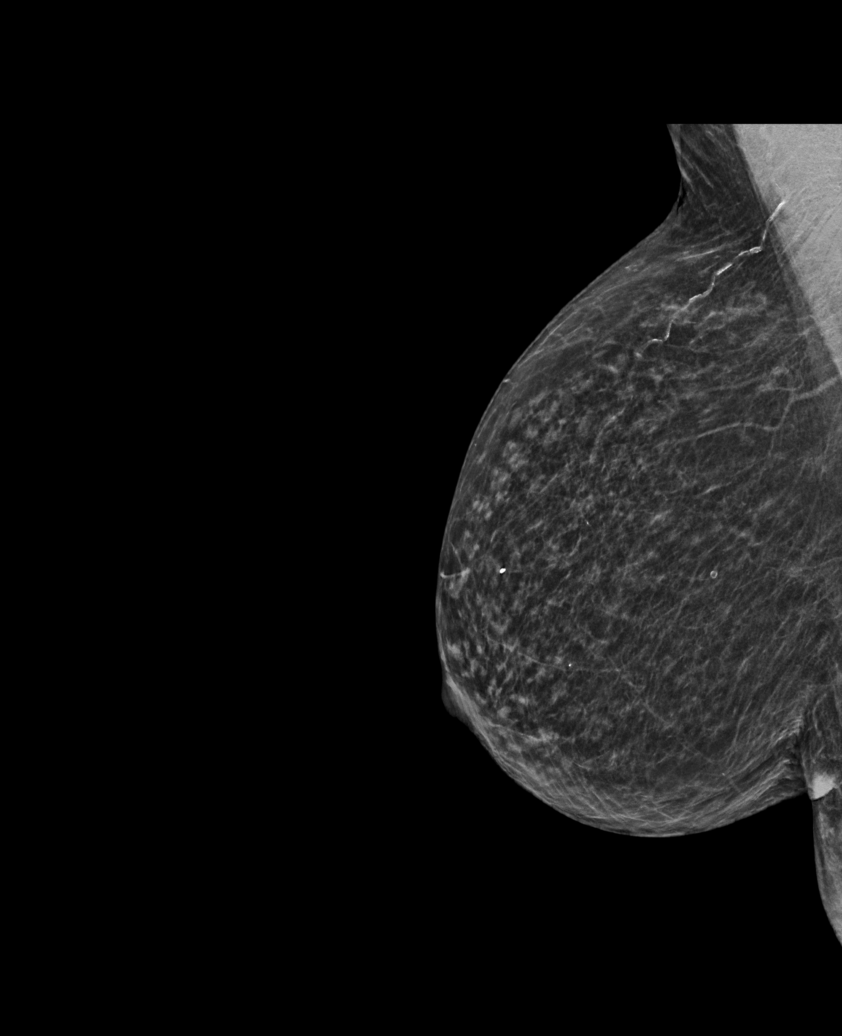

[L MLO synth-2D (2 of 2)]
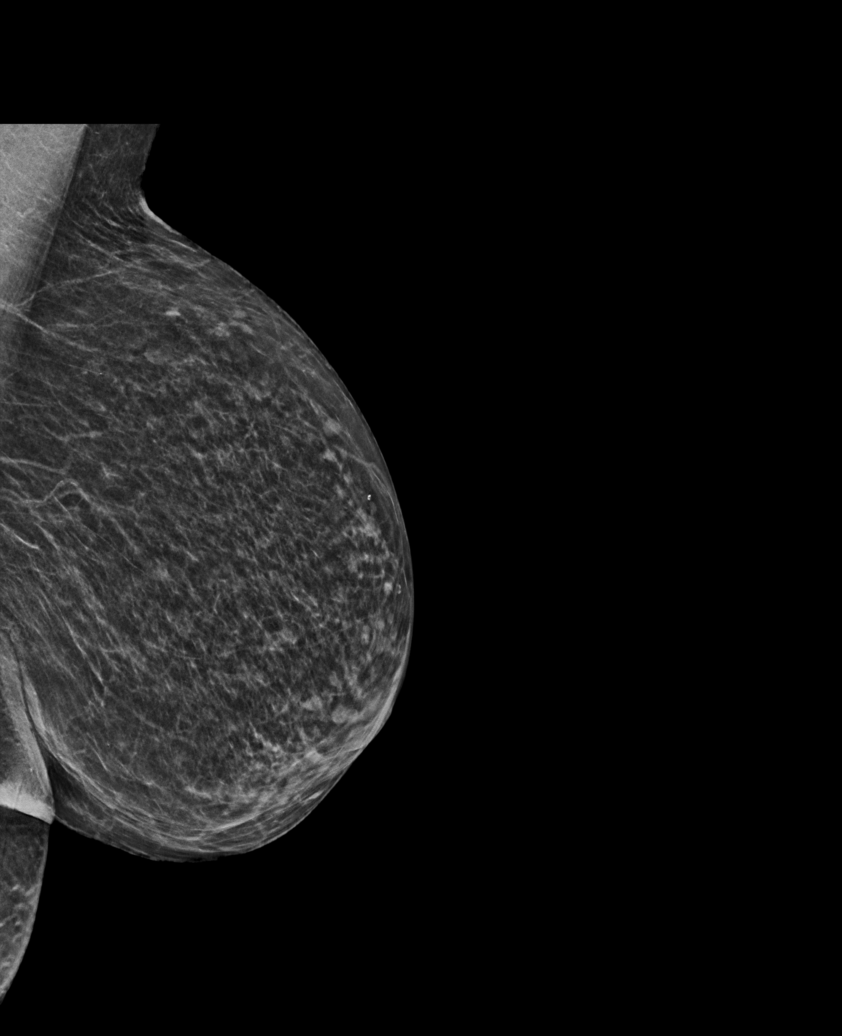

[6 of 36 positions shown; findings below may reference images not displayed]

ACR Breast Density Category b: There are scattered areas of
fibroglandular density.
FINDINGS: There are no findings suspicious for malignancy.
IMPRESSION: No mammographic evidence of malignancy. A result letter of this
screening mammogram will be mailed directly to the patient.

RECOMMENDATION:
Screening mammogram in one year. (Code:51-O-LD2)

BI-RADS CATEGORY  1: Negative.

## 2022-10-15 ENCOUNTER — Emergency Department: Payer: Medicare Other

## 2022-10-15 ENCOUNTER — Encounter: Payer: Self-pay | Admitting: Internal Medicine

## 2022-10-15 ENCOUNTER — Observation Stay: Payer: Medicare Other

## 2022-10-15 ENCOUNTER — Observation Stay
Admission: EM | Admit: 2022-10-15 | Discharge: 2022-10-16 | Disposition: A | Discharge status: Home Health | Payer: Medicare Other | Attending: Internal Medicine | Admitting: Internal Medicine

## 2022-10-15 ENCOUNTER — Other Ambulatory Visit: Payer: Self-pay

## 2022-10-15 DIAGNOSIS — I6782 Cerebral ischemia: Secondary | ICD-10-CM | POA: Diagnosis not present

## 2022-10-15 DIAGNOSIS — M7070 Other bursitis of hip, unspecified hip: Secondary | ICD-10-CM | POA: Insufficient documentation

## 2022-10-15 DIAGNOSIS — E11319 Type 2 diabetes mellitus with unspecified diabetic retinopathy without macular edema: Secondary | ICD-10-CM | POA: Insufficient documentation

## 2022-10-15 DIAGNOSIS — N183 Chronic kidney disease, stage 3 unspecified: Secondary | ICD-10-CM

## 2022-10-15 DIAGNOSIS — I10 Essential (primary) hypertension: Secondary | ICD-10-CM | POA: Diagnosis not present

## 2022-10-15 DIAGNOSIS — I081 Rheumatic disorders of both mitral and tricuspid valves: Secondary | ICD-10-CM | POA: Diagnosis not present

## 2022-10-15 DIAGNOSIS — Z794 Long term (current) use of insulin: Secondary | ICD-10-CM

## 2022-10-15 DIAGNOSIS — R2681 Unsteadiness on feet: Secondary | ICD-10-CM | POA: Diagnosis not present

## 2022-10-15 DIAGNOSIS — Z7982 Long term (current) use of aspirin: Secondary | ICD-10-CM | POA: Diagnosis not present

## 2022-10-15 DIAGNOSIS — E1122 Type 2 diabetes mellitus with diabetic chronic kidney disease: Secondary | ICD-10-CM | POA: Diagnosis not present

## 2022-10-15 DIAGNOSIS — R2689 Other abnormalities of gait and mobility: Secondary | ICD-10-CM | POA: Diagnosis not present

## 2022-10-15 DIAGNOSIS — Z7984 Long term (current) use of oral hypoglycemic drugs: Secondary | ICD-10-CM | POA: Insufficient documentation

## 2022-10-15 DIAGNOSIS — E039 Hypothyroidism, unspecified: Secondary | ICD-10-CM

## 2022-10-15 DIAGNOSIS — R262 Difficulty in walking, not elsewhere classified: Secondary | ICD-10-CM | POA: Diagnosis not present

## 2022-10-15 DIAGNOSIS — R5383 Other fatigue: Secondary | ICD-10-CM | POA: Diagnosis present

## 2022-10-15 DIAGNOSIS — R7989 Other specified abnormal findings of blood chemistry: Secondary | ICD-10-CM

## 2022-10-15 DIAGNOSIS — Z79899 Other long term (current) drug therapy: Secondary | ICD-10-CM | POA: Diagnosis not present

## 2022-10-15 DIAGNOSIS — I5A Non-ischemic myocardial injury (non-traumatic): Secondary | ICD-10-CM | POA: Diagnosis not present

## 2022-10-15 DIAGNOSIS — I639 Cerebral infarction, unspecified: Principal | ICD-10-CM

## 2022-10-15 DIAGNOSIS — E1140 Type 2 diabetes mellitus with diabetic neuropathy, unspecified: Secondary | ICD-10-CM | POA: Diagnosis not present

## 2022-10-15 DIAGNOSIS — R519 Headache, unspecified: Secondary | ICD-10-CM | POA: Insufficient documentation

## 2022-10-15 DIAGNOSIS — Z7902 Long term (current) use of antithrombotics/antiplatelets: Secondary | ICD-10-CM | POA: Diagnosis not present

## 2022-10-15 DIAGNOSIS — D631 Anemia in chronic kidney disease: Secondary | ICD-10-CM | POA: Diagnosis not present

## 2022-10-15 DIAGNOSIS — I129 Hypertensive chronic kidney disease with stage 1 through stage 4 chronic kidney disease, or unspecified chronic kidney disease: Secondary | ICD-10-CM | POA: Insufficient documentation

## 2022-10-15 DIAGNOSIS — N1832 Chronic kidney disease, stage 3b: Secondary | ICD-10-CM

## 2022-10-15 DIAGNOSIS — M6281 Muscle weakness (generalized): Secondary | ICD-10-CM | POA: Insufficient documentation

## 2022-10-15 DIAGNOSIS — E119 Type 2 diabetes mellitus without complications: Secondary | ICD-10-CM

## 2022-10-15 DIAGNOSIS — R55 Syncope and collapse: Secondary | ICD-10-CM

## 2022-10-15 LAB — LIPID PANEL
Cholesterol: 187 mg/dL (ref 0–200)
HDL: 55 mg/dL (ref >40)
LDL Cholesterol: 109 mg/dL — ABNORMAL HIGH (ref 0–99)
Total CHOL/HDL Ratio: 3.4 RATIO
Triglycerides: 115 mg/dL (ref <150)
VLDL: 23 mg/dL (ref 0–40)

## 2022-10-15 LAB — URINALYSIS, ROUTINE W REFLEX MICROSCOPIC
Bacteria, UA: NONE SEEN
Bilirubin Urine: NEGATIVE
Glucose, UA: NEGATIVE mg/dL
Ketones, ur: NEGATIVE mg/dL
Leukocytes,Ua: NEGATIVE
Nitrite: NEGATIVE
Protein, ur: 100 mg/dL — AB
Specific Gravity, Urine: 1.023 (ref 1.005–1.030)
pH: 5 (ref 5.0–8.0)

## 2022-10-15 LAB — BASIC METABOLIC PANEL
Anion gap: 10 (ref 5–15)
BUN: 21 mg/dL (ref 8–23)
CO2: 24 mmol/L (ref 22–32)
Calcium: 9.5 mg/dL (ref 8.9–10.3)
Chloride: 102 mmol/L (ref 98–111)
Creatinine, Ser: 1.59 mg/dL — ABNORMAL HIGH (ref 0.44–1.00)
GFR, Estimated: 31 mL/min — ABNORMAL LOW (ref >60)
Glucose, Bld: 179 mg/dL — ABNORMAL HIGH (ref 70–99)
Potassium: 4.1 mmol/L (ref 3.5–5.1)
Sodium: 136 mmol/L (ref 135–145)

## 2022-10-15 LAB — TROPONIN I (HIGH SENSITIVITY)
Troponin I (High Sensitivity): 87 ng/L — ABNORMAL HIGH (ref <18)
Troponin I (High Sensitivity): 66 ng/L — ABNORMAL HIGH (ref <18)

## 2022-10-15 LAB — TSH: TSH: 4.428 u[IU]/mL (ref 0.350–4.500)

## 2022-10-15 LAB — CBC
HCT: 43 % (ref 36.0–46.0)
Hemoglobin: 13.7 g/dL (ref 12.0–15.0)
MCH: 28.9 pg (ref 26.0–34.0)
MCHC: 31.9 g/dL (ref 30.0–36.0)
MCV: 90.7 fL (ref 80.0–100.0)
Platelets: 234 10*3/uL (ref 150–400)
RBC: 4.74 MIL/uL (ref 3.87–5.11)
RDW: 14.3 % (ref 11.5–15.5)
WBC: 6.6 10*3/uL (ref 4.0–10.5)
nRBC: 0 % (ref 0.0–0.2)

## 2022-10-15 LAB — CBG MONITORING, ED: Glucose-Capillary: 140 mg/dL — ABNORMAL HIGH (ref 70–99)

## 2022-10-15 LAB — GLUCOSE, CAPILLARY
Glucose-Capillary: 108 mg/dL — ABNORMAL HIGH (ref 70–99)
Glucose-Capillary: 130 mg/dL — ABNORMAL HIGH (ref 70–99)

## 2022-10-15 LAB — HEMOGLOBIN A1C
Hgb A1c MFr Bld: 6.4 % — ABNORMAL HIGH (ref 4.8–5.6)
Mean Plasma Glucose: 136.98 mg/dL

## 2022-10-15 MED ORDER — HYDRALAZINE HCL 20 MG/ML IJ SOLN
5.0000 mg | Freq: Four times a day (QID) | INTRAMUSCULAR | Status: DC | PRN
  Administered 2022-10-15: 5 mg via INTRAVENOUS
  Filled 2022-10-15: qty 1

## 2022-10-15 MED ORDER — PANTOPRAZOLE SODIUM 40 MG PO TBEC
40.0000 mg | DELAYED_RELEASE_TABLET | Freq: Every day | ORAL | Status: DC
  Administered 2022-10-16: 40 mg via ORAL
  Filled 2022-10-15: qty 1

## 2022-10-15 MED ORDER — ACETAMINOPHEN 325 MG PO TABS
650.0000 mg | ORAL_TABLET | Freq: Four times a day (QID) | ORAL | Status: DC | PRN
  Administered 2022-10-15: 650 mg via ORAL
  Filled 2022-10-15: qty 2

## 2022-10-15 MED ORDER — INSULIN ASPART 100 UNIT/ML IJ SOLN
0.0000 [IU] | Freq: Three times a day (TID) | INTRAMUSCULAR | Status: DC
  Administered 2022-10-16 (×3): 1 [IU] via SUBCUTANEOUS
  Filled 2022-10-15 (×3): qty 1

## 2022-10-15 MED ORDER — POLYETHYLENE GLYCOL 3350 17 G PO PACK: 17.0000 g | PACK | Freq: Every day | ORAL | Status: DC | PRN

## 2022-10-15 MED ORDER — MAGNESIUM OXIDE 400 MG PO TABS
400.0000 mg | ORAL_TABLET | Freq: Two times a day (BID) | ORAL | Status: DC
  Administered 2022-10-15 – 2022-10-16 (×2): 400 mg via ORAL
  Filled 2022-10-15 (×5): qty 1

## 2022-10-15 MED ORDER — ASPIRIN 81 MG PO TBEC
81.0000 mg | DELAYED_RELEASE_TABLET | Freq: Every day | ORAL | Status: DC
  Administered 2022-10-15 – 2022-10-16 (×2): 81 mg via ORAL
  Filled 2022-10-15 (×2): qty 1

## 2022-10-15 MED ORDER — ENOXAPARIN SODIUM 30 MG/0.3ML IJ SOSY
30.0000 mg | PREFILLED_SYRINGE | INTRAMUSCULAR | Status: DC
  Administered 2022-10-15: 30 mg via SUBCUTANEOUS
  Filled 2022-10-15: qty 0.3

## 2022-10-15 MED ORDER — GABAPENTIN 100 MG PO CAPS
100.0000 mg | ORAL_CAPSULE | Freq: Three times a day (TID) | ORAL | Status: DC
  Administered 2022-10-15 – 2022-10-16 (×3): 100 mg via ORAL
  Filled 2022-10-15 (×3): qty 1

## 2022-10-15 MED ORDER — LISINOPRIL 10 MG PO TABS: 20.0000 mg | ORAL_TABLET | Freq: Once | ORAL | Status: DC

## 2022-10-15 MED ORDER — AMLODIPINE BESYLATE 5 MG PO TABS
10.0000 mg | ORAL_TABLET | Freq: Once | ORAL | Status: AC
  Administered 2022-10-15: 10 mg via ORAL
  Filled 2022-10-15: qty 2

## 2022-10-15 MED ORDER — FUROSEMIDE 20 MG PO TABS: 20.0000 mg | ORAL_TABLET | Freq: Every day | ORAL | Status: DC

## 2022-10-15 MED ORDER — ONDANSETRON HCL 4 MG/2ML IJ SOLN: 4.0000 mg | Freq: Four times a day (QID) | INTRAMUSCULAR | Status: DC | PRN

## 2022-10-15 MED ORDER — ONDANSETRON HCL 4 MG PO TABS: 4.0000 mg | ORAL_TABLET | Freq: Four times a day (QID) | ORAL | Status: DC | PRN

## 2022-10-15 MED ORDER — ACETAMINOPHEN 650 MG RE SUPP: 650.0000 mg | Freq: Four times a day (QID) | RECTAL | Status: DC | PRN

## 2022-10-15 MED ORDER — LEVOTHYROXINE SODIUM 50 MCG PO TABS
50.0000 ug | ORAL_TABLET | Freq: Every day | ORAL | Status: DC
  Administered 2022-10-16: 50 ug via ORAL
  Filled 2022-10-15: qty 1

## 2022-10-15 MED ORDER — AMLODIPINE BESYLATE 10 MG PO TABS: 10.0000 mg | ORAL_TABLET | Freq: Every day | ORAL | Status: DC

## 2022-10-15 MED ORDER — SODIUM CHLORIDE 0.9% FLUSH
3.0000 mL | Freq: Two times a day (BID) | INTRAVENOUS | Status: DC
  Administered 2022-10-15 – 2022-10-16 (×2): 3 mL via INTRAVENOUS

## 2022-10-15 NOTE — Progress Notes (Signed)
Chap visited Allison Rogers at her bedside. Her grand daughter was in the room. Pt awaiting evaluation by the doctors. Chap introduced Spiritual Care to the Pt. Page Mirna Mires as need arise.   10/15/22 1300  Spiritual Encounters  Type of Visit Initial  Care provided to: Pt and family  Referral source Chaplain assessment  Reason for visit Routine spiritual support  OnCall Visit No  Spiritual Framework  Presenting Themes Goals in life/care;Courage hope and growth  Community/Connection Family  Patient Stress Factors Lack of knowledge  Interventions  Spiritual Care Interventions Made Encouragement;Compassionate presence;Established relationship of care and support  Intervention Outcomes  Outcomes Reduced anxiety;Reduced isolation;Awareness of support  Spiritual Care Plan  Spiritual Care Issues Still Outstanding No further spiritual care needs at this time (see row info)

## 2022-10-15 NOTE — ED Provider Notes (Signed)
[facility] Provider Note    Event Date/Time   First MD Initiated Contact with Patient 10/15/22 1304     (approximate)   History   Fatigue   HPI  Allison Rogers is a 88 y.o. female history of bradycardia type 2 diabetes and previous hypovolemic shock, hypertension  Today, patient was riding to [place] with family when she seemed very fatigued.  When they arrived to the store they were unable to wake her and she had passed out.  Report is that she was hypotensive initially with EMS arrival.  She started to come to and is now feeling fine  Patient reports no pain concerns discomfort or other symptoms.  No chest pain no nausea no vomiting no difficulty breathing.  Reports she passed out once in November that was sort of similar   Reports feeling somewhat fatigued.  No difficulty speaking no numbness or weakness in the arms or legs.  No headache  Physical Exam   Triage Vital Signs: ED Triage Vitals  Enc Vitals Group     BP 10/15/22 1102 (!) 160/74     Pulse Rate 10/15/22 1102 65     Resp 10/15/22 1102 17     Temp 10/15/22 1102 98 F (36.7 C)     Temp Source 10/15/22 1102 Oral     SpO2 10/15/22 1102 98 %     Weight 10/15/22 1103 220 lb (99.8 kg)     Height 10/15/22 1103 5\' 5"  (1.651 m)     Head Circumference --      Peak Flow --      Pain Score 10/15/22 1103 0     Pain Loc --      Pain Edu? --      Excl. in GC? --     Most recent vital signs: Vitals:   10/15/22 1600 10/15/22 1606  BP:  (!) 193/99  Pulse:  66  Resp:  19  Temp: 97.7 F (36.5 C)   SpO2:  100%     General: Awake, no distress.  Normocephalic atraumatic.  Speaks with clear sentences.  Fully awake fully alert moves all extremities well without difficulty CV:  Good peripheral perfusion.  Normal tones and rate Resp:  Normal effort.  Clear bilateral Abd:  No distention.  Soft nontender nondistended Other:  Fully alert moves all extremities well no noted neurologic deficits.   Normocephalic atraumatic   ED Results / Procedures / Treatments   Labs (all labs ordered are listed, but only abnormal results are displayed) Labs Reviewed  BASIC METABOLIC PANEL - Abnormal; Notable for the following components:      Result Value   Glucose, Bld 179 (*)    Creatinine, Ser 1.59 (*)    GFR, Estimated 31 (*)    All other components within normal limits  URINALYSIS, ROUTINE W REFLEX MICROSCOPIC - Abnormal; Notable for the following components:   Color, Urine YELLOW (*)    APPearance HAZY (*)    Hgb urine dipstick SMALL (*)    Protein, ur 100 (*)    All other components within normal limits  CBG MONITORING, ED - Abnormal; Notable for the following components:   Glucose-Capillary 140 (*)    All other components within normal limits  TROPONIN I (HIGH SENSITIVITY) - Abnormal; Notable for the following components:   Troponin I (High Sensitivity) 87 (*)    All other components within normal limits  TROPONIN I (HIGH SENSITIVITY) - Abnormal; Notable for the following components:  Troponin I (High Sensitivity) 66 (*)    All other components within normal limits  CBC  TSH  HEMOGLOBIN A1C  LIPID PANEL  CBG MONITORING, ED     EKG   EKG interpreted by me at 1110 heart rate 70 QRS 100 QTc 470 Normal sinus rhythm, probable left ventricular hypertrophy with repolarization abnormality.  Compared with prior EKG from February 13, 2022 no significant changes noted   RADIOLOGY  CT Head Wo Contrast  Result Date: 10/15/2022 CLINICAL DATA:  Provided history: Head trauma, intracranial venous injury suspected. EXAM: CT HEAD WITHOUT CONTRAST TECHNIQUE: Contiguous axial images were obtained from the base of the skull through the vertex without intravenous contrast. RADIATION DOSE REDUCTION: This exam was performed according to the departmental dose-optimization program which includes automated exposure control, adjustment of the mA and/or kV according to patient size and/or use of  iterative reconstruction technique. COMPARISON:  Head CT 09/01/2020. FINDINGS: Brain: No age advanced or lobar predominant parenchymal atrophy. Patchy and ill-defined hypoattenuation within the cerebral white matter, nonspecific but compatible with mild chronic small vessel ischemic disease. Prominent perivascular space within the inferior left basal ganglia. Partially empty sella turcica. There is no acute intracranial hemorrhage. No demarcated cortical infarct. No extra-axial fluid collection. No evidence of an intracranial mass. No midline shift. Vascular: No hyperdense vessel.  Atherosclerotic calcifications. Skull: No calvarial fracture or aggressive osseous lesion. Sinuses/Orbits: No mass or acute finding within the imaged orbits. No significant paranasal sinus disease at the imaged levels. IMPRESSION: 1.  No evidence of an acute intracranial abnormality. 2. Mild chronic small vessel ischemic changes within the cerebral white matter. Electronically Signed   By: Jackey Loge D.O.   On: 10/15/2022 15:01      PROCEDURES:  Critical Care performed: No  Procedures   MEDICATIONS ORDERED IN ED: Medications  enoxaparin (LOVENOX) injection 30 mg (has no administration in time range)  sodium chloride flush (NS) 0.9 % injection 3 mL (has no administration in time range)  acetaminophen (TYLENOL) tablet 650 mg (has no administration in time range)    Or  acetaminophen (TYLENOL) suppository 650 mg (has no administration in time range)  ondansetron (ZOFRAN) tablet 4 mg (has no administration in time range)    Or  ondansetron (ZOFRAN) injection 4 mg (has no administration in time range)  polyethylene glycol (MIRALAX / GLYCOLAX) packet 17 g (has no administration in time range)  hydrALAZINE (APRESOLINE) injection 5 mg (has no administration in time range)  insulin aspart (novoLOG) injection 0-9 Units (has no administration in time range)  amLODipine (NORVASC) tablet 10 mg (has no administration in time  range)  amLODipine (NORVASC) tablet 10 mg (10 mg Oral Given 10/15/22 1501)     IMPRESSION / MDM / ASSESSMENT AND PLAN / ED COURSE  I reviewed the triage vital signs and the nursing notes.                              Differential diagnosis includes, but is not limited to, syncope, vasovagal, dysrhythmia, dehydration, metabolic or acute cardiac or other cause.  Differential is quite broad.  Thankfully the patient's neurologic status seems to have recovered.  Originally was reported to have hypotension but she is now moderately hypertensive and throughout her ER stay her blood pressure has increased.  Will provide her antihypertensive.  She then later started develop feeling of headache, at this point CT of the head is performed which is negative for  acute.  She has no neurologic deficits.  Consulted with her hospitalist Dr. Huel Cote, patient accepted hospitalist service for further care and treatment.  She does have a very mildly increased troponin which is downtrending.  Question if the patient may have had a brief episode of demand ischemia, without active chest pain I do not see evidence to suggest acute ACS at this time but will require observation due to elevated troponin.  Patient and family understand agreeable with this plan.  Labs include CBC which is normal comprehensive metabolic panel shows chronic renal dysfunction  Patient's presentation is most consistent with acute complicated illness / injury requiring diagnostic workup.   The patient is on the cardiac monitor to evaluate for evidence of arrhythmia and/or significant heart rate changes.   Clinical Course as of 10/15/22 1652  Fri Oct 15, 2022  1419 Patient now reporting mild headache, she also has not had anything to eat yet today.  Will check glucose.  Additionally, will obtain CT of the head at this point given she is now notably hypertensive and complaining of headache after syncopal episode.  She has no neurologic deficit and  is fully awake and alert.  Pharmacist reviewed and notes that the patient has not filled her antihypertensive medications in several months [MQ]    Clinical Course User Index [MQ] Sharyn Creamer, MD     FINAL CLINICAL IMPRESSION(S) / ED DIAGNOSES   Final diagnoses:  Syncope and collapse     Rx / DC Orders   ED Discharge Orders     None        Note:  This document was prepared using Dragon voice recognition software and may include unintentional dictation errors.   Sharyn Creamer, MD 10/15/22 [phone]

## 2022-10-15 NOTE — H&P (Addendum)
History and Physical    Patient: Allison Rogers ZOX:[id number] DOB: 1935-03-04 DOA: 10/15/2022 DOS: the patient was seen and examined on 10/15/2022 PCP: Leanna Sato, MD  Patient coming from: Home  Chief Complaint:  Chief Complaint  Patient presents with   Fatigue   HPI: Allison Rogers is a 88 y.o. female with medical history significant of CKD stage IIIb, associated anemia of chronic renal disease, type 2 diabetes, hypertension, osteoporosis, who presents to the ED due to fatigue.  Allison Rogers states that she was in her usual state of health this morning when she went on a shopping trip with her daughters.  After arrival to the store, her daughters tried to wake her up and she was unable to be awoken.  Her daughter states that she tried slapping her hands and rubbing her chest without any improvement.  They noted that Allison Rogers was breathing erratically and her eyes seem to have rolled back.  EMS was called and patient was able to be awoken.  After awakening, she was in her usual state of health without any complaints.  At the time, she only complained of feeling tired.  Patient's daughters state that this has happened in the past before. This episode was different as it was much harder to wake patient up and took much longer.    Allison Rogers endorses increased urination over the last 2 days, but otherwise denies any fever, chills, nausea, vomiting, diarrhea, chest pain, shortness of breath, lower extremity swelling or any focal weakness.  No jerking of the limbs seen while unconscious.  Family notes that patient has a poor appetite for the last 1 year and they believe she has lost some weight.  ED course: On arrival to the ED, patient was hypertensive at 160/74 with subsequent rise up to 207/89.  She was saturating at 98% on room air.  She was afebrile at 98. Initial workup demonstrated normal CBC, and BMP with glucose of 179, creatinine 1.59, BUN 21 and GFR of 31.  Initial troponin  elevated 87.  Due to concern for syncope with elevated troponin, [facility] contacted for admission.  Review of Systems: As mentioned in the history of present illness. All other systems reviewed and are negative.  Past Medical History:  Diagnosis Date   Hyperlipidemia    Hypertension    Hypothyroidism    Type 2 diabetes mellitus (HCC)    No past surgical history on file. Social History:  has no history on file for tobacco use, alcohol use, and drug use.  Allergies  Allergen Reactions   Lactose Intolerance (Gi)    Penicillins Itching and Rash    Family History  Problem Relation Age of Onset   Breast cancer Neg Hx     Prior to Admission medications   Medication Sig Start Date End Date Taking? Authorizing Provider  amLODipine (NORVASC) 10 MG tablet Take 1 tablet (10 mg total) by mouth daily. 09/05/20   Lurene Shadow, MD  aspirin 81 MG EC tablet Take 81 mg by mouth daily.    [provider]  atorvastatin (LIPITOR) 40 MG tablet Take 40 mg by mouth daily. 03/15/17   [provider]  brimonidine (ALPHAGAN) 0.2 % ophthalmic solution Place 1 drop into both eyes 2 (two) times daily. 03/15/17   [provider]  CALCIUM 600/VITAMIN D 600-400 MG-UNIT TABS Take 1 tablet by mouth 2 (two) times daily. 06/03/20   [provider]  furosemide (LASIX) 20 MG tablet Take 1 tablet (20 mg total)  by mouth daily. 09/05/20   Lurene Shadow, MD  gabapentin (NEURONTIN) 100 MG capsule Take 100 mg by mouth 3 (three) times daily. 03/15/17   [provider]  levothyroxine (SYNTHROID) 50 MCG tablet Take 50 mcg by mouth daily. 03/13/20   [provider]  lisinopril (ZESTRIL) 20 MG tablet Take 2 tablets (40 mg total) by mouth daily. 09/05/20   Lurene Shadow, MD  metFORMIN (GLUCOPHAGE) 500 MG tablet Take 1 tablet by mouth 2 (two) times daily. 03/24/17   [provider]  potassium chloride (MICRO-K) 10 MEQ CR capsule Take 10 mEq by mouth daily. 03/13/20   [provider]  tiZANidine (ZANAFLEX) 4 MG tablet Take 4 mg by mouth 2 (two) times daily as needed. 04/18/17   [provider]  TRADJENTA 5 MG TABS tablet Take 5 mg by mouth daily. 04/03/20   [provider]  VITAMIN D-1000 MAX ST 25 MCG (1000 UT) tablet Take 1,000 Units by mouth daily. 03/13/20   [provider]  VOLTAREN 1 % GEL SMARTSIG:Sparingly Topical Twice Daily PRN 03/28/20   [provider]    Physical Exam: Vitals:   10/15/22 1515 10/15/22 1600 10/15/22 1606 10/15/22 1702  BP:   (!) 193/99 (!) 191/109  Pulse: 73  66 79  Resp: 19  19 20   Temp:  97.7 F (36.5 C)  98.2 F (36.8 C)  TempSrc:  Oral  Oral  SpO2: 100%  100% 100%  Weight:      Height:       Physical Exam Vitals and nursing note reviewed.  Constitutional:      General: She is not in acute distress.    Appearance: She is obese. She is not toxic-appearing.  HENT:     Head: Normocephalic and atraumatic.     Mouth/Throat:     Mouth: Mucous membranes are moist.     Pharynx: Oropharynx is clear.  Eyes:     Extraocular Movements: Extraocular movements intact.     Conjunctiva/sclera: Conjunctivae normal.     Pupils: Pupils are equal, round, and reactive to light.  Cardiovascular:     Rate and Rhythm: Normal rate and regular rhythm.  Pulmonary:     Effort: Pulmonary effort is normal. No respiratory distress.     Breath sounds: Normal breath sounds. No wheezing, rhonchi or rales.  Abdominal:     General: Bowel sounds are normal. There is no distension.     Palpations: Abdomen is soft.     Tenderness: There is no abdominal tenderness. There is no guarding.  Musculoskeletal:     Right lower leg: No edema.     Left lower leg: No edema.  Skin:    General: Skin is warm and dry.  Neurological:     Mental Status: She is alert.     Comments:  Patient is alert and oriented x 3 No facial asymmetry or dysarthria Sensation intact throughout 4 out of 5 strength  throughout Finger-to-nose normal on the left.  Unable to perform on the right due to pain with IV  Psychiatric:        Mood and Affect: Mood normal.        Behavior: Behavior normal.    Data Reviewed: CBC with WBC 6.6, hemoglobin 13.7, platelets of 234 BMP with sodium of 136, potassium 4.1, bicarb 24, glucose 179, BUN 21, creatinine 1.59 with GFR of 31 Initial troponin 87  EKG personally reviewed.  Sinus rhythm with rate of 68.  T wave inversion  in both inferior and lateral leads with ST elevation in V1, V2.  Compared to EKG obtained in November 2023, no changes noted.  Results are pending, will review when available.  Assessment and Plan:  * Syncope Patient is presenting with concern for syncope that began as patient seeming asleep and then unable to wake up.  Previous episode in the past where patient was seen in the ED and felt to be due to hypovolemia.  Unclear etiology at this point, not quite consistent with seizure.  Given recurrence, will perform further workup.  If negative testing, may need to consider functional neurological disorder.    - Telemetry monitoring - MRI of the brain - Carotid ultrasound - Echocardiogram - A1c and lipid panel - TSH - Vitamin B12  Essential hypertension Patient's daughter states that recently nephrology has removed numerous antihypertensives from regimen.  Per pharmacy, antihypertensives have not been filled in over 6 months.  Significant hypertension today  - Restart amlodipine 10 mg daily - Hydralazine 5 mg every 8 hours as needed for SBP over 180  Elevated troponin Mild troponin leak of 86 with downtrend to 66.  No EKG changes and patient denies any chest pain.  Low suspicion for active cardiovascular process.  - Echocardiogram ordered - Telemetry monitoring  Type 2 diabetes mellitus (HCC) - SSI, sensitive - A1c pending  Hypothyroidism - Resume home Synthroid  Chronic kidney disease, stage III (moderate) (HCC) Renal function  currently at baseline.  Patient follows with outpatient nephrology.  -Continue to monitor renal function while admitted  Advance Care Planning:   Code Status: Full Code verified by patient  Consults: None  Family Communication: Patient's 2 daughters and son updated at bedside  Severity of Illness: The appropriate patient status for this patient is OBSERVATION. Observation status is judged to be reasonable and necessary in order to provide the required intensity of service to ensure the patient's safety. The patient's presenting symptoms, physical exam findings, and initial radiographic and laboratory data in the context of their medical condition is felt to place them at decreased risk for further clinical deterioration. Furthermore, it is anticipated that the patient will be medically stable for discharge from the hospital within 2 midnights of admission.   Author: Verdene Lennert, MD 10/15/2022 5:14 PM  For on call review [email or web address].

## 2022-10-15 NOTE — ED Triage Notes (Signed)
Pt sts that she was traveling in the care with family to [place] and per the family pt passed out. Pt sts that she doesn't think she passed out and that she is just really tired. Pt has no complaints except being tired.

## 2022-10-15 NOTE — Assessment & Plan Note (Addendum)
Patient is presenting with concern for syncope that began as patient seeming asleep and then unable to wake up.  Previous episode in the past where patient was seen in the ED and felt to be due to hypovolemia.  I do not think this is seizure with no bowel or bladder dysfunction.  Neurology did not believe this is secondary to stroke.  Could be secondary to medication.  The patient does take tizanidine twice a day and also Tylenol PM at night.  Advised to get rid of Benadryl and taper tizanidine to 2mg  at bedtime then stop with slight orthostatic vitals.  Will also hold lasix for one week.

## 2022-10-15 NOTE — Assessment & Plan Note (Signed)
Hemoglobin A1c borderline at 6.4

## 2022-10-15 NOTE — Assessment & Plan Note (Signed)
-   Resume home Synthroid 

## 2022-10-15 NOTE — Assessment & Plan Note (Deleted)
Mild troponin leak of 86 with downtrend to 66.  No EKG changes and patient denies any chest pain.  Low suspicion for active cardiovascular process.  - Echocardiogram ordered - Telemetry monitoring

## 2022-10-15 NOTE — Assessment & Plan Note (Addendum)
Renal function currently at baseline.  Patient follows with outpatient nephrology.  -Continue to monitor renal function while admitted

## 2022-10-15 NOTE — Assessment & Plan Note (Signed)
Patient's daughter states that recently nephrology has removed numerous antihypertensives from regimen.  Per pharmacy, antihypertensives have not been filled in over 6 months.  Significant hypertension today  - Restart amlodipine 10 mg daily - Hydralazine 5 mg every 8 hours as needed for SBP over 180

## 2022-10-16 ENCOUNTER — Observation Stay
Admit: 2022-10-16 | Discharge: 2022-10-16 | Disposition: A | Discharge status: Home / Self Care | Payer: Medicare Other | Attending: Internal Medicine | Admitting: Internal Medicine

## 2022-10-16 ENCOUNTER — Observation Stay: Payer: Medicare Other

## 2022-10-16 DIAGNOSIS — E1121 Type 2 diabetes mellitus with diabetic nephropathy: Secondary | ICD-10-CM

## 2022-10-16 DIAGNOSIS — I951 Orthostatic hypotension: Secondary | ICD-10-CM | POA: Diagnosis not present

## 2022-10-16 DIAGNOSIS — I5A Non-ischemic myocardial injury (non-traumatic): Secondary | ICD-10-CM | POA: Insufficient documentation

## 2022-10-16 DIAGNOSIS — I639 Cerebral infarction, unspecified: Principal | ICD-10-CM

## 2022-10-16 DIAGNOSIS — R55 Syncope and collapse: Secondary | ICD-10-CM | POA: Diagnosis not present

## 2022-10-16 DIAGNOSIS — N1831 Chronic kidney disease, stage 3a: Secondary | ICD-10-CM

## 2022-10-16 DIAGNOSIS — I10 Essential (primary) hypertension: Secondary | ICD-10-CM | POA: Diagnosis not present

## 2022-10-16 LAB — CBC
HCT: 40.9 % (ref 36.0–46.0)
Hemoglobin: 13.1 g/dL (ref 12.0–15.0)
MCH: 28.7 pg (ref 26.0–34.0)
MCHC: 32 g/dL (ref 30.0–36.0)
MCV: 89.7 fL (ref 80.0–100.0)
Platelets: 225 10*3/uL (ref 150–400)
RBC: 4.56 MIL/uL (ref 3.87–5.11)
RDW: 14.6 % (ref 11.5–15.5)
WBC: 7.1 10*3/uL (ref 4.0–10.5)
nRBC: 0 % (ref 0.0–0.2)

## 2022-10-16 LAB — BASIC METABOLIC PANEL
Anion gap: 8 (ref 5–15)
BUN: 25 mg/dL — ABNORMAL HIGH (ref 8–23)
CO2: 24 mmol/L (ref 22–32)
Calcium: 9.3 mg/dL (ref 8.9–10.3)
Chloride: 106 mmol/L (ref 98–111)
Creatinine, Ser: 1.38 mg/dL — ABNORMAL HIGH (ref 0.44–1.00)
GFR, Estimated: 37 mL/min — ABNORMAL LOW (ref >60)
Glucose, Bld: 124 mg/dL — ABNORMAL HIGH (ref 70–99)
Potassium: 4 mmol/L (ref 3.5–5.1)
Sodium: 138 mmol/L (ref 135–145)

## 2022-10-16 LAB — ECHOCARDIOGRAM COMPLETE
AR max vel: 2.17 cm2
AV Area VTI: 2.3 cm2
AV Area mean vel: 2.08 cm2
AV Mean grad: 6.5 mmHg
AV Peak grad: 14.9 mmHg
Ao pk vel: 1.93 m/s
Area-P 1/2: 2.57 cm2
Height: 65 in
MV VTI: 2.97 cm2
S' Lateral: 2.7 cm
Weight: 3520 oz

## 2022-10-16 LAB — GLUCOSE, CAPILLARY
Glucose-Capillary: 147 mg/dL — ABNORMAL HIGH (ref 70–99)
Glucose-Capillary: 136 mg/dL — ABNORMAL HIGH (ref 70–99)
Glucose-Capillary: 126 mg/dL — ABNORMAL HIGH (ref 70–99)

## 2022-10-16 LAB — VITAMIN B12: Vitamin B-12: 243 pg/mL (ref 180–914)

## 2022-10-16 MED ORDER — VITAMIN B-12 1000 MCG PO TABS
1000.0000 ug | ORAL_TABLET | Freq: Every day | ORAL | Status: DC
  Administered 2022-10-16: 1000 ug via ORAL
  Filled 2022-10-16: qty 1

## 2022-10-16 MED ORDER — TIZANIDINE HCL 2 MG PO TABS: ORAL_TABLET | ORAL | 0 refills | Status: DC

## 2022-10-16 MED ORDER — TIZANIDINE HCL 2 MG PO TABS
2.0000 mg | ORAL_TABLET | Freq: Two times a day (BID) | ORAL | Status: DC
  Filled 2022-10-16: qty 1

## 2022-10-16 MED ORDER — CYANOCOBALAMIN 1000 MCG PO TABS: 1000.0000 ug | ORAL_TABLET | Freq: Every day | ORAL | 0 refills | Status: AC

## 2022-10-16 MED ORDER — TIZANIDINE HCL 2 MG PO TABS: ORAL_TABLET | ORAL | 0 refills | Status: AC

## 2022-10-16 MED ORDER — CLOPIDOGREL BISULFATE 75 MG PO TABS
75.0000 mg | ORAL_TABLET | Freq: Every day | ORAL | Status: DC
  Administered 2022-10-16: 75 mg via ORAL
  Filled 2022-10-16: qty 1

## 2022-10-16 MED ORDER — ATORVASTATIN CALCIUM 80 MG PO TABS: 80.0000 mg | ORAL_TABLET | Freq: Every day | ORAL | 0 refills | Status: DC

## 2022-10-16 MED ORDER — GADOBUTROL 1 MMOL/ML IV SOLN
10.0000 mL | Freq: Once | INTRAVENOUS | Status: AC | PRN
  Administered 2022-10-16: 10 mL via INTRAVENOUS

## 2022-10-16 MED ORDER — ENOXAPARIN SODIUM 60 MG/0.6ML IJ SOSY: 0.5000 mg/kg | PREFILLED_SYRINGE | INTRAMUSCULAR | Status: DC

## 2022-10-16 NOTE — Progress Notes (Signed)
PHARMACIST - PHYSICIAN COMMUNICATION  CONCERNING:  Enoxaparin (Lovenox) for DVT Prophylaxis   RECOMMENDATION: Patient was prescribed enoxaprin 40mg  q24 hours for VTE prophylaxis.   Filed Weights   10/15/22 1103  Weight: 99.8 kg (220 lb)    Body mass index is 36.61 kg/m.  Estimated Creatinine Clearance: 34.2 mL/min (A) (by C-G formula based on SCr of 1.38 mg/dL (H)).  Based on [facility] policy patient is candidate for enoxaparin 0.5mg /kg TBW SQ every 24 hours based on BMI being >30.  DESCRIPTION: Pharmacy has adjusted enoxaparin dose per [facility] policy.  Patient is now receiving enoxaparin 50 mg every 24 hours   Tressie Ellis 10/16/2022 11:11 AM

## 2022-10-16 NOTE — Hospital Course (Addendum)
88 y.o. female with medical history significant of CKD stage IIIb, associated anemia of chronic renal disease, type 2 diabetes, hypertension, osteoporosis, who presents to the ED due to fatigue.   Allison Rogers states that she was in her usual state of health this morning when she went on a shopping trip with her daughters.  After arrival to the store, her daughters tried to wake her up and she was unable to be awoken.  Her daughter states that she tried slapping her hands and rubbing her chest without any improvement.  They noted that Allison Rogers was breathing erratically and her eyes seem to have rolled back.  EMS was called and patient was able to be awoken.  After awakening, she was in her usual state of health without any complaints.  At the time, she only complained of feeling tired.   Patient's daughters state that this has happened in the past before. This episode was different as it was much harder to wake patient up and took much longer.     Allison Rogers endorses increased urination over the last 2 days, but otherwise denies any fever, chills, nausea, vomiting, diarrhea, chest pain, shortness of breath, lower extremity swelling or any focal weakness.  No jerking of the limbs seen while unconscious.   Family notes that patient has a poor appetite for the last 1 year and they believe she has lost some weight.  7/6.  Patient feeling better.  Feeling stronger.  Did well with PT.  Found to have a stroke on MRI last night.  Neurology ordered MRA of the head and neck which was negative.  With slight orthostatsis, will taper zanaflex faster (2mg  at bedtime for one week then stop).  Will hold lasix for one week.  Discontinue benadryl. Increase Lipitor to 80 mg nightly.

## 2022-10-16 NOTE — Consult Note (Signed)
Neurology Consultation Reason for Consult: Punctate stroke on MRI Requesting Physician: Alford Highland   CC: Difficult to arouse, sleepiness   History is obtained from: Patient, multiple daughters at bedside   HPI: Allison Rogers is a 88 y.o. with a past medical history significant for hypertension, type 2 diabetes complicated by retinopathy and neuropathy as well as CKD with GFR 37, BMI 36.61, osteoarthritis of the bilateral knees and shoulders (right greater than left)  Family notes the patient tends to minimize her symptoms.  They feel that about once a month she seems very hard to wake up, but this is the longest lasting episode.  Typical episodes last only a few minutes.  Yesterday when they were driving in the car after they left her home until they got to [facility] she was very hard to awake the entire time.  Initially they thought she was just napping but then she was very hard to wake up on arrival.  She was also just slightly confused prior to leaving, asking them to give her change to a deceased or not present family member.  Otherwise intermittently when she was awoken during the trip she was able to repeat where she was and did not seem confused. She had no post-event confusion, no episodes of tongue biting or bowel/bladder incontinence.   Similar presentation in 02/2022, shorter lasting at the time  LKW: ~ 9 AM on 7/5 Thrombolytic given?: No, too mild to treat IA performed?: No, exam not consistent with LVO Premorbid modified rankin scale:      3 - Moderate disability. Able to look after own affairs without assistance, but unable to carry out all previous activities.  Stopped driving 5 years ago, family helps with cooking.  She manages her own finances and medications.  She has been using a cane/walker for several years but is independent with these  ROS: All other review of systems was negative except as noted in the HPI, incidental positives: - chronic right shoulder pain and  knee pain - weight loss gradual over one year, small meals but good nutrition per family - sleeps well, no snoring, wakes 1x to use bathroom - no caffeine, no ethanol, no smoking - No seizure risk factors (no Birth and development issues, Febrile seizures in childhood, Significant head trauma, Intracranial surgeries, Mengingitis/Encephalitis history, Family history of seizures)   Past Medical History:  Diagnosis Date   Hyperlipidemia    Hypertension    Hypothyroidism    Type 2 diabetes mellitus (HCC)    No past surgical history on file.  Current Outpatient Medications  Medication Instructions   aspirin EC 81 mg, Oral, Daily   CALCIUM 600/VITAMIN D 600-400 MG-UNIT TABS 1 tablet, Oral, 2 times daily   cholecalciferol (VITAMIN D3) 1,000 Units, Oral, Daily   furosemide (LASIX) 20 mg, Oral, Daily   gabapentin (NEURONTIN) 100 mg, Oral, 3 times daily   ibuprofen (ADVIL) 800 mg, 3 times daily with meals   levothyroxine (SYNTHROID) 50 mcg, Oral, Daily   loratadine (CLARITIN) 10 mg, Oral, Daily   magnesium oxide (MAG-OX) 400 mg, Oral, 2 times daily   omeprazole (PRILOSEC) 20 mg, Oral, Daily   tiZANidine (ZANAFLEX) 4 mg, Oral, 2 times daily PRN   Voltaren 2 g, Topical, 2 times daily PRN  Also takes tylenol PM nightly per report to Dr. Renae Gloss   Family History  Problem Relation Age of Onset   Breast cancer Neg Hx     Social History:  reports that she has never smoked. She  has never used smokeless tobacco. No history on file for alcohol use and drug use.   Exam: Current vital signs: BP (!) 157/70 (BP Location: Left Arm)   Pulse 72   Temp 98 F (36.7 C)   Resp 18   Ht 5\' 5"  (1.651 m)   Wt 99.8 kg   SpO2 99%   BMI 36.61 kg/m  Vital signs in last 24 hours: Temp:  [97.7 F (36.5 C)-98.7 F (37.1 C)] 98 F (36.7 C) (07/06 0722) Pulse Rate:  [64-90] 72 (07/06 0722) Resp:  [13-24] 18 (07/06 0722) BP: (147-219)/(70-116) 157/70 (07/06 0722) SpO2:  [91 %-100 %] 99 % (07/06  0722) Weight:  [99.8 kg] 99.8 kg (07/05 1103)   Physical Exam  Constitutional: Appears well-developed and well-nourished.  Psych: Affect pleasant and cooperative Eyes: No scleral injection HENT: No oropharyngeal obstruction.  MSK: no major joint deformities, some swelling of the knees.  Cardiovascular: Perfusing extremities well Respiratory: Effort normal, non-labored breathing GI: Soft.  No distension. There is no tenderness.  Skin: Warm dry and intact visible skin  Neuro: Mental Status: Patient is awake, alert, oriented to person, place, month, year, and situation. Patient is able to give a clear and coherent history. No signs of aphasia or neglect Cranial Nerves: II: Visual Fields are full. Pupils are equal, round, and reactive to light.   III,IV, VI: EOMI without ptosis or diploplia.  V: Facial sensation is symmetric to temperature VII: Facial movement is symmetric.  VIII: hearing is intact to voice X: Uvula elevates symmetrically XI: Shoulder shrug is symmetric. XII: tongue is midline without atrophy or fasciculations.  Motor: Tone is normal. Bulk is normal. Pain limited deltoids 4-/5 on the left, 3/5 on the right. Hip flexion 3/5, knee extension pain limited 4/5, otherwise 5/5 throughout  Sensory: Sensation is symmetric to light touch and temperature in the arms and legs. Deep Tendon Reflexes: 2+ and symmetric in the brachioradialis (BRS) and 2+ but diminished relative to BRS in the patellae.  Cerebellar: FNF and HKS are intact bilaterally Gait:  Deferred  NIHSS total 0  I have reviewed labs in epic and the results pertinent to this consultation are:  Basic Metabolic Panel: Recent Labs  Lab 10/15/22 1105 10/16/22 0421  NA 136 138  K 4.1 4.0  CL 102 106  CO2 24 24  GLUCOSE 179* 124*  BUN 21 25*  CREATININE 1.59* 1.38*  CALCIUM 9.5 9.3    CBC: Recent Labs  Lab 10/15/22 1105 10/16/22 0421  WBC 6.6 7.1  HGB 13.7 13.1  HCT 43.0 40.9  MCV 90.7 89.7   PLT 234 225    Coagulation Studies: No results for input(s): "LABPROT", "INR" in the last 72 hours.   Lab Results  Component Value Date   VITAMINB12 243 10/16/2022    Lab Results  Component Value Date   TSH 4.428 10/15/2022    Lab Results  Component Value Date   HGBA1C 6.4 (H) 10/15/2022    Lab Results  Component Value Date   CHOL 187 10/15/2022   HDL 55 10/15/2022   LDLCALC 109 (H) 10/15/2022   TRIG 115 10/15/2022   CHOLHDL 3.4 10/15/2022    I have reviewed the images obtained:  MRI brain personally reviewed, agree with radiology:   Punctate acute infarct in the periventricular right frontoparietal region.  Reviewed with family at bedside, mild for age/comorbidities microvascular disease  ECHO:  1. Left ventricular ejection fraction, by estimation, is 60 to 65%. The  left ventricle has  normal function. The left ventricle has no regional  wall motion abnormalities. There is moderate left ventricular hypertrophy.  Left ventricular diastolic  parameters are consistent with Grade I diastolic dysfunction (impaired  relaxation). The average left ventricular global longitudinal strain is  -10.8 %.   2. Right ventricular systolic function is normal. The right ventricular  size is normal.   3. The mitral valve is normal in structure. Mild mitral valve  regurgitation. No evidence of mitral stenosis.   4. The aortic valve is tricuspid. Aortic valve regurgitation is not  visualized. No aortic stenosis is present.   5. The inferior vena cava is normal in size with greater than 50%  respiratory variability, suggesting right atrial pressure of 3 mmHg.  [Normal biatrial sizes]  Impression: Somnolence favored secondary to sedating medications. Incidental stroke, location typical for small vessel disease but prudent to optimize stroke risk factors.   Recommendations:  # Incidental stroke not related to symptoms - MRA head w/o and neck w/ and w/o to complete stroke workup - I  will follow-up vessel imaging and consider Plavix 300 mg loading dose followed by 21 or 90 course depending on extent of atherosclerotic disease   - Addendum: Given minimal atherosclerotic disease and patient noted to have orthostatsis, which increases falls risk, will hold off on plavix - Atorvastatin for goal LDL < 70 (109 today); if already taking 40 mg please increase to 80 mg, if not on statin start at 40 mg daily to reduce risk of side effects  - Continue home ASA 81 mg  - Diet/exercise/weight loss counseling - No need for PT/OT/SLP as patient at baseline (cancelled orders)  # Low normal B12 - 1000 mcg daily with goal > 400   # Somnolence - Taper tizanidine to 2 mg BID x 1 week, then 2 mg at bedtime and see if symptoms improve; okay to taper faster given orthostatic hypotension - Taper tylenol PM at night, switch to regular tylenol  - Defer to outpatient provider if patient has significant pain off tizanidine may need to restart or consider other agents - If continued somnolence off sedating meds (gabapentin dose could also be considered for reduction, but she is only on 300 total daily dose and max for her current CrCl of 34 would be 900 / day), consider sleep study   Brooke Dare MD-PhD [facility] [phone] [facility] coverage for [facility] is from 8 AM to 4 AM in-house and 4 PM to 8 PM by telephone/video. 8 PM to 8 AM emergent questions or overnight urgent questions should be addressed to Teleneurology On-call or [facility] neurohospitalist; contact information can be found on AMION  Discussed with Dr. Renae Gloss in person and via secure chat

## 2022-10-16 NOTE — TOC Progression Note (Addendum)
Transition of Care [facility]) - Progression Note    Patient Details  Name: Allison Rogers MRN: [id number] Date of Birth: 1934/08/13  Transition of [facility]) CM/SW Contact  Bing Quarry, RN Phone Number: 10/16/2022, 4:17 PM  Clinical Narrative: 7/6: Attempted to speak with patient regarding assessment, HH, MOON notice, but was in MRI, son was present but asked CM to call back to speak to patient later.  Gabriel Cirri MSN RN CM  Transitions of Care Department [facility] [phone] Weekends Only            Expected Discharge Plan and Services                                               Social Determinants of Health (SDOH) Interventions SDOH Screenings   Food Insecurity: No Food Insecurity (10/15/2022)  Housing: Patient Declined (10/15/2022)  Transportation Needs: No Transportation Needs (10/15/2022)  Utilities: Not At Risk (10/15/2022)  Tobacco Use: Low Risk  (10/15/2022)    Readmission Risk Interventions     No data to display

## 2022-10-16 NOTE — Evaluation (Signed)
Physical Therapy Evaluation Patient Details Name: Allison Rogers MRN: [id number] DOB: August 03, 1934 Today's Date: 10/16/2022  History of Present Illness  Allison Rogers is a 88 y.o. female admitted s/p syncopal episode with medical history significant of CKD stage IIIb, associated anemia of chronic renal disease, type 2 diabetes, hypertension, osteoporosis.  Clinical Impression  Pt is a pleasant 88 year old female who was admitted for recent syncopal episode. Pt performs supine<>sit supervision, transfers min guard/min a, and ambulation with min guard. Patient sitting EOB IND at start of session with daughter [name] in the room. Sit<>stand with RW and rocking x3 min guard only with patient request. Decreased trunk extension coming into standing. Ambulated with RW 60ft and min guard, increased elbow, trunk and knee flexion with gait. Slow and variable gait pattern, Thereasa Parkin has concerns for falls despite lack of fall HX. Returned to bed supervision with call bell, alarm set and daughter in room. Pt demonstrates deficits with balance, ROM, mobility, strength, endurance and decreased DME knowledge. Pt will continue to receive skilled PT services while admitted and will defer to TOC/care team for updates regarding disposition planning.         Assistance Recommended at Discharge Intermittent Supervision/Assistance  If plan is discharge home, recommend the following:  Can travel by private vehicle  A little help with walking and/or transfers;Assistance with cooking/housework;Help with stairs or ramp for entrance;Assist for transportation        Equipment Recommendations None recommended by PT  Recommendations for Other Services       Functional Status Assessment Patient has had a recent decline in their functional status and demonstrates the ability to make significant improvements in function in a reasonable and predictable amount of time.     Precautions / Restrictions Precautions Precautions:  Fall Restrictions Weight Bearing Restrictions: No      Mobility  Bed Mobility Overal bed mobility: Needs Assistance Bed Mobility: Sit to Supine       Sit to supine: Independent   General bed mobility comments: Pt. able to lay in bed and scoot IND.    Transfers Overall transfer level: Needs assistance Equipment used: Rolling walker (2 wheels) Transfers: Sit to/from Stand Sit to Stand: Supervision           General transfer comment: [place] x 3 to come into standing, difficulty with trunk extension w/out b/l UE support.    Ambulation/Gait Ambulation/Gait assistance: Min guard Gait Distance (Feet): 60 Feet Assistive device: Rolling walker (2 wheels) Gait Pattern/deviations: Step-to pattern, Step-through pattern, Decreased step length - right, Decreased step length - left, Knee flexed in stance - right, Knee flexed in stance - left, Trunk flexed, Narrow base of support       General Gait Details: Increased trunk and knee flexion w/gait. Intervals of step-to and step through gait pattern.  Stairs            Wheelchair Mobility     Tilt Bed    Modified Rankin (Stroke Patients Only)       Balance Overall balance assessment: Needs assistance Sitting-balance support: Feet supported, No upper extremity supported Sitting balance-Leahy Scale: Normal     Standing balance support: Bilateral upper extremity supported, During functional activity, Reliant on assistive device for balance Standing balance-Leahy Scale: Good Standing balance comment: increased trunk flexion and knee flexion in stance.                             Pertinent Vitals/Pain Pain  Assessment Pain Assessment: No/denies pain    Home Living Family/patient expects to be discharged to:: Private residence Living Arrangements: Children Available Help at Discharge: Family;Available 24 hours/day Type of Home: House Home Access: Ramped entrance       Home Layout: One level Home  Equipment: Agricultural consultant (2 wheels);Cane - single point;Shower seat Additional Comments: Has 10, 1 deceased. 2 live out of state and 8 live in or around [place]. 1 daughter lives w/ her 24/7 and another is in [place].    Prior Function Prior Level of Function : Needs assist       Physical Assist : Mobility (physical);ADLs (physical) Mobility (physical): Stairs ADLs (physical): IADLs   ADLs Comments: Does not cook, clean, or drive. IND for bathing/grooming.     Hand Dominance        Extremity/Trunk Assessment   Upper Extremity Assessment Upper Extremity Assessment: Overall WFL for tasks assessed    Lower Extremity Assessment Lower Extremity Assessment: Generalized weakness       Communication   Communication: No difficulties  Cognition Arousal/Alertness: Awake/alert Behavior During Therapy: WFL for tasks assessed/performed Overall Cognitive Status: Within Functional Limits for tasks assessed                                          General Comments      Exercises     Assessment/Plan    PT Assessment Patient needs continued PT services  PT Problem List         PT Treatment Interventions DME instruction;Gait training;Functional mobility training;Therapeutic activities;Therapeutic exercise;Balance training;Patient/family education    PT Goals (Current goals can be found in the Care Plan section)  Acute Rehab PT Goals Patient Stated Goal: To go home PT Goal Formulation: With patient/family Time For Goal Achievement: 10/30/22 Potential to Achieve Goals: Fair    Frequency Min 2X/week     Co-evaluation               AM-PAC PT "6 Clicks" Mobility  Outcome Measure Help needed turning from your back to your side while in a flat bed without using bedrails?: None Help needed moving from lying on your back to sitting on the side of a flat bed without using bedrails?: None Help needed moving to and from a bed to a chair (including a  wheelchair)?: A Little Help needed standing up from a chair using your arms (e.g., wheelchair or bedside chair)?: A Little Help needed to walk in hospital room?: A Little Help needed climbing 3-5 steps with a railing? : A Lot 6 Click Score: 19    End of Session Equipment Utilized During Treatment: Gait belt Activity Tolerance: Patient tolerated treatment well Patient left: in bed;with call bell/phone within reach;with bed alarm set;with family/visitor present Nurse Communication: Mobility status PT Visit Diagnosis: Unsteadiness on feet (R26.81);Other abnormalities of gait and mobility (R26.89);Muscle weakness (generalized) (M62.81);Difficulty in walking, not elsewhere classified (R26.2)    Time: 4098-1191 PT Time Calculation (min) (ACUTE ONLY): 15 min   Charges:                 Malachi Carl, SPT   Malachi Carl 10/16/2022, 9:10 AM

## 2022-10-16 NOTE — Progress Notes (Signed)
MD order received in [facility] to discharge pt home with home health PT today; previously asked TOC to call the pt at home tomorrow to set up home health services due to the time of day for discharge; verbally reviewed AVS with the pt; Rxs escribed to the [facility] on [address] in [place], [place]; no questions voiced at this time; pt discharged via wheelchair by nursing to the [facility] entrance

## 2022-10-16 NOTE — Discharge Summary (Signed)
Physician Discharge Summary   Patient: Allison Rogers MRN: [id number] DOB: 01-01-1935  Admit date:     10/15/2022  Discharge date: 10/16/22  Discharge Physician: Alford Highland   PCP: Leanna Sato, MD   Recommendations at discharge:    Follow up PCP 5 days Follow up outpatient neurology  Discharge Diagnoses: Principal Problem:   Syncope Active Problems:   Acute stroke due to ischemia [facility])   Essential hypertension   Type 2 diabetes mellitus (HCC)   Hypothyroidism   Chronic kidney disease, stage III (moderate) (HCC)   Myocardial injury    Hospital Course: 88 y.o. female with medical history significant of CKD stage IIIb, associated anemia of chronic renal disease, type 2 diabetes, hypertension, osteoporosis, who presents to the ED due to fatigue.   Allison Rogers states that she was in her usual state of health this morning when she went on a shopping trip with her daughters.  After arrival to the store, her daughters tried to wake her up and she was unable to be awoken.  Her daughter states that she tried slapping her hands and rubbing her chest without any improvement.  They noted that Allison Rogers was breathing erratically and her eyes seem to have rolled back.  EMS was called and patient was able to be awoken.  After awakening, she was in her usual state of health without any complaints.  At the time, she only complained of feeling tired.   Patient's daughters state that this has happened in the past before. This episode was different as it was much harder to wake patient up and took much longer.     Allison Rogers endorses increased urination over the last 2 days, but otherwise denies any fever, chills, nausea, vomiting, diarrhea, chest pain, shortness of breath, lower extremity swelling or any focal weakness.  No jerking of the limbs seen while unconscious.   Family notes that patient has a poor appetite for the last 1 year and they believe she has lost some weight.  7/6.   Patient feeling better.  Feeling stronger.  Did well with PT.  Found to have a stroke on MRI last night.  Neurology ordered MRA of the head and neck which was negative.  With slight orthostatsis, will taper zanaflex faster (2mg  at bedtime for one week then stop).  Will hold lasix for one week.  Discontinue benadryl. Increase Lipitor to 80 mg nightly.  Assessment and Plan: * Syncope Patient is presenting with concern for syncope that began as patient seeming asleep and then unable to wake up.  Previous episode in the past where patient was seen in the ED and felt to be due to hypovolemia.  I do not think this is seizure with no bowel or bladder dysfunction.  Neurology did not believe this is secondary to stroke.  Could be secondary to medication.  The patient does take tizanidine twice a day and also Tylenol PM at night.  Advised to get rid of Benadryl and taper tizanidine to 2mg  at bedtime then stop with slight orthostatic vitals.  Will also hold lasix for one week.  Acute stroke due to ischemia [facility]) MRA head and neck negative and neurology recommends asa alone secondary to slight orthostatics.  Essential hypertension Patient's daughter states that recently nephrology has removed medications.  Patient with slight orthostatics.  Advised to stay hydrated.  Hold lasix for one week.   Type 2 diabetes mellitus (HCC) Hemoglobin A1c borderline at 6.4  Hypothyroidism Continue Synthroid.  TSH 4.428.  Chronic kidney disease, stage III (moderate) (HCC) CKD stage 3a.  Creatinine slightly improved to 1.38 with a GFR of 37.  Held Lasix today.  Myocardial injury Troponin slightly elevated with stroke but trending down.  No complaints of chest pain.         Consultants: Neurology Procedures performed: none  Disposition: Home health Diet recommendation:  Cardiac and Carb modified diet DISCHARGE MEDICATION: Allergies as of 10/16/2022       Reactions   Lactose Intolerance (gi)    Penicillins  Itching, Rash        Medication List     STOP taking these medications    furosemide 20 MG tablet Commonly known as: LASIX   ibuprofen 800 MG tablet Commonly known as: ADVIL       TAKE these medications    aspirin EC 81 MG tablet Take 81 mg by mouth daily.   atorvastatin 80 MG tablet Commonly known as: Lipitor Take 1 tablet (80 mg total) by mouth daily.   Calcium 600/Vitamin D 600-10 MG-MCG Tabs Generic drug: Calcium Carb-Cholecalciferol Take 1 tablet by mouth 2 (two) times daily.   cholecalciferol 25 MCG (1000 UNIT) tablet Commonly known as: VITAMIN D3 Take 1,000 Units by mouth daily.   cyanocobalamin 1000 MCG tablet Take 1 tablet (1,000 mcg total) by mouth daily. Start taking on: October 17, 2022   gabapentin 100 MG capsule Commonly known as: NEURONTIN Take 100 mg by mouth 3 (three) times daily.   levothyroxine 50 MCG tablet Commonly known as: SYNTHROID Take 50 mcg by mouth daily.   loratadine 10 MG tablet Commonly known as: CLARITIN Take 10 mg by mouth daily.   magnesium oxide 400 MG tablet Commonly known as: MAG-OX Take 400 mg by mouth 2 (two) times daily.   omeprazole 20 MG capsule Commonly known as: PRILOSEC Take 20 mg by mouth daily.   tiZANidine 2 MG tablet Commonly known as: ZANAFLEX One tab po twice a day for one week then one tab po nightly for one week then stop What changed:  medication strength how much to take how to take this when to take this reasons to take this additional instructions   Voltaren 1 % Gel Generic drug: diclofenac Sodium Apply 2 g topically 2 (two) times daily as needed.        Follow-up Information     Leanna Sato, MD Follow up in 5 day(s).   Specialty: Family Medicine Contact information: [address] [place] [address] [phone]         [facility] Follow up in 3 week(s).   Contact information: [address]     [address] [place]  [address] [phone]               Discharge Exam: Ceasar Mons Weights   10/15/22 1103  Weight: 99.8 kg   Physical Exam HENT:     Head: Normocephalic.     Mouth/Throat:     Pharynx: No oropharyngeal exudate.  Eyes:     General: Lids are normal.     Conjunctiva/sclera: Conjunctivae normal.  Cardiovascular:     Rate and Rhythm: Normal rate and regular rhythm.     Heart sounds: Normal heart sounds, S1 normal and S2 normal.  Pulmonary:     Breath sounds: No decreased breath sounds, wheezing, rhonchi or rales.  Abdominal:     Palpations: Abdomen is soft.     Tenderness: There is no abdominal tenderness.  Musculoskeletal:  Right lower leg: Swelling present.     Left lower leg: Swelling present.  Skin:    General: Skin is warm.     Findings: No rash.  Neurological:     Mental Status: She is alert.     Comments: Answers all questions appropriately.  Able to straight leg raise bilaterally.  Power 5 out of 5 upper and lower extremities.      Condition at discharge: stable  The results of significant diagnostics from this hospitalization (including imaging, microbiology, ancillary and laboratory) are listed below for reference.   Imaging Studies: MR ANGIO HEAD WO CONTRAST  Result Date: 10/16/2022 CLINICAL DATA:  Infarct seen on brain MRI. EXAM: MRA NECK WITHOUT AND WITH CONTRAST MRA HEAD WITHOUT CONTRAST TECHNIQUE: Multiplanar and multiecho pulse sequences of the neck were obtained without and with intravenous contrast. Angiographic images of the neck were obtained using MRA technique without and with intravenous contast.; Angiographic images of the Circle of Willis were obtained using MRA technique without intravenous contrast. CONTRAST:  10mL GADAVIST GADOBUTROL 1 MMOL/ML IV SOLN COMPARISON:  Carotid Doppler 1 day prior. FINDINGS: MRA NECK FINDINGS Aortic arch: The imaged aortic arch is normal. The origins of the major branch vessels are patent. The subclavian arteries are  patent to the level imaged. Right carotid system: The right common, internal, and external carotid arteries are patent, without hemodynamically significant stenosis or occlusion. There is no evidence of dissection or aneurysm. Left carotid system: Left common, internal, and external carotid arteries are patent, without hemodynamically significant stenosis or occlusion there is no evidence of dissection or aneurysm. Vertebral arteries: The bilateral V4 segments are patent, with antegrade flow. There is no evidence of hemodynamically significant stenosis or occlusion there is no evidence of dissection or aneurysm. Other: None. MRA HEAD FINDINGS Anterior circulation: The intracranial ICAs are patent, without significant stenosis or occlusion. The bilateral MCAs are patent, without proximal stenosis or occlusion. The bilateral ACAS are patent, without proximal stenosis or occlusion. The anterior communicating artery is normal. There is no aneurysm or AVM. Posterior circulation: The bilateral V4 segments are patent. The basilar artery is patent. The major cerebellar arteries appear patent. The bilateral PCAs are patent, without proximal stenosis or occlusion. Posterior communicating arteries are not identified. There is no aneurysm or AVM. Anatomic variants: None. Other: None. IMPRESSION: Normal MRA of the head and neck. Electronically Signed   By: Lesia Hausen M.D.   On: 10/16/2022 18:00   MR ANGIO NECK W WO CONTRAST  Result Date: 10/16/2022 CLINICAL DATA:  Infarct seen on brain MRI. EXAM: MRA NECK WITHOUT AND WITH CONTRAST MRA HEAD WITHOUT CONTRAST TECHNIQUE: Multiplanar and multiecho pulse sequences of the neck were obtained without and with intravenous contrast. Angiographic images of the neck were obtained using MRA technique without and with intravenous contast.; Angiographic images of the Circle of Willis were obtained using MRA technique without intravenous contrast. CONTRAST:  10mL GADAVIST GADOBUTROL 1  MMOL/ML IV SOLN COMPARISON:  Carotid Doppler 1 day prior. FINDINGS: MRA NECK FINDINGS Aortic arch: The imaged aortic arch is normal. The origins of the major branch vessels are patent. The subclavian arteries are patent to the level imaged. Right carotid system: The right common, internal, and external carotid arteries are patent, without hemodynamically significant stenosis or occlusion. There is no evidence of dissection or aneurysm. Left carotid system: Left common, internal, and external carotid arteries are patent, without hemodynamically significant stenosis or occlusion there is no evidence of dissection or aneurysm. Vertebral arteries: The  bilateral V4 segments are patent, with antegrade flow. There is no evidence of hemodynamically significant stenosis or occlusion there is no evidence of dissection or aneurysm. Other: None. MRA HEAD FINDINGS Anterior circulation: The intracranial ICAs are patent, without significant stenosis or occlusion. The bilateral MCAs are patent, without proximal stenosis or occlusion. The bilateral ACAS are patent, without proximal stenosis or occlusion. The anterior communicating artery is normal. There is no aneurysm or AVM. Posterior circulation: The bilateral V4 segments are patent. The basilar artery is patent. The major cerebellar arteries appear patent. The bilateral PCAs are patent, without proximal stenosis or occlusion. Posterior communicating arteries are not identified. There is no aneurysm or AVM. Anatomic variants: None. Other: None. IMPRESSION: Normal MRA of the head and neck. Electronically Signed   By: Lesia Hausen M.D.   On: 10/16/2022 18:00   ECHOCARDIOGRAM COMPLETE  Result Date: 10/16/2022    ECHOCARDIOGRAM REPORT   Patient Name:   DAJANE PILCHER Date of Exam: 10/16/2022 Medical Rec #:  [id number]      Height:       65.0 in Accession #:    [id number]     Weight:       220.0 lb Date of Birth:  [birth year]      BSA:          2.060 m Patient Age:    88 years        BP:           157/70 mmHg Patient Gender: F              HR:           85 bpm. Exam Location:  [facility] Procedure: 2D Echo, Cardiac Doppler, Color Doppler and Strain Analysis Indications:     Syncope  History:         Patient has prior history of Echocardiogram examinations, most                  recent 09/03/2020. Arrythmias:Bradycardia,                  Signs/Symptoms:Syncope; Risk Factors:Hypertension, Diabetes and                  Dyslipidemia. CKD.  Sonographer:     Mikki Harbor Referring Phys:  [id number] Verdene Lennert Diagnosing Phys: Julien Nordmann MD  Sonographer Comments: Suboptimal subcostal window. Global longitudinal strain was attempted. IMPRESSIONS  1. Left ventricular ejection fraction, by estimation, is 60 to 65%. The left ventricle has normal function. The left ventricle has no regional wall motion abnormalities. There is moderate left ventricular hypertrophy. Left ventricular diastolic parameters are consistent with Grade I diastolic dysfunction (impaired relaxation). The average left ventricular global longitudinal strain is -10.8 %.  2. Right ventricular systolic function is normal. The right ventricular size is normal.  3. The mitral valve is normal in structure. Mild mitral valve regurgitation. No evidence of mitral stenosis.  4. The aortic valve is tricuspid. Aortic valve regurgitation is not visualized. No aortic stenosis is present.  5. The inferior vena cava is normal in size with greater than 50% respiratory variability, suggesting right atrial pressure of 3 mmHg. FINDINGS  Left Ventricle: Left ventricular ejection fraction, by estimation, is 60 to 65%. The left ventricle has normal function. The left ventricle has no regional wall motion abnormalities. The average left ventricular global longitudinal strain is -10.8 %. The left ventricular internal cavity size was normal in size. There is moderate left ventricular hypertrophy. Left ventricular  diastolic parameters are consistent with Grade  I diastolic dysfunction (impaired relaxation). Right Ventricle: The right ventricular size is normal. No increase in right ventricular wall thickness. Right ventricular systolic function is normal. Left Atrium: Left atrial size was normal in size. Right Atrium: Right atrial size was normal in size. Pericardium: There is no evidence of pericardial effusion. Mitral Valve: The mitral valve is normal in structure. Mild mitral valve regurgitation. No evidence of mitral valve stenosis. MV peak gradient, 5.8 mmHg. The mean mitral valve gradient is 2.0 mmHg. Tricuspid Valve: The tricuspid valve is normal in structure. Tricuspid valve regurgitation is mild . No evidence of tricuspid stenosis. Aortic Valve: The aortic valve is tricuspid. Aortic valve regurgitation is not visualized. No aortic stenosis is present. Aortic valve mean gradient measures 6.5 mmHg. Aortic valve peak gradient measures 14.9 mmHg. Aortic valve area, by VTI measures 2.30  cm. Pulmonic Valve: The pulmonic valve was normal in structure. Pulmonic valve regurgitation is not visualized. No evidence of pulmonic stenosis. Aorta: The aortic root is normal in size and structure. Venous: The inferior vena cava is normal in size with greater than 50% respiratory variability, suggesting right atrial pressure of 3 mmHg. IAS/Shunts: No atrial level shunt detected by color flow Doppler.  LEFT VENTRICLE PLAX 2D LVIDd:         3.90 cm   Diastology LVIDs:         2.70 cm   LV e' medial:    6.42 cm/s LV PW:         1.50 cm   LV E/e' medial:  7.7 LV IVS:        1.50 cm   LV e' lateral:   5.87 cm/s LVOT diam:     2.10 cm   LV E/e' lateral: 8.4 LV SV:         71 LV SV Index:   34        2D Longitudinal Strain LVOT Area:     3.46 cm  2D Strain GLS Avg:     -10.8 %  RIGHT VENTRICLE RV Basal diam:  2.75 cm RV Mid diam:    3.00 cm RV S prime:     12.20 cm/s TAPSE (M-mode): 1.7 cm LEFT ATRIUM             Index        RIGHT ATRIUM           Index LA diam:        4.10 cm 1.99  cm/m   RA Area:     14.00 cm LA Vol (A2C):   55.3 ml 26.85 ml/m  RA Volume:   33.50 ml  16.26 ml/m LA Vol (A4C):   42.7 ml 20.73 ml/m LA Biplane Vol: 49.7 ml 24.13 ml/m  AORTIC VALVE                     PULMONIC VALVE AV Area (Vmax):    2.17 cm      PV Vmax:       1.44 m/s AV Area (Vmean):   2.08 cm      PV Peak grad:  8.3 mmHg AV Area (VTI):     2.30 cm AV Vmax:           193.00 cm/s AV Vmean:          112.000 cm/s AV VTI:            0.309 m AV Peak Grad:  14.9 mmHg AV Mean Grad:      6.5 mmHg LVOT Vmax:         121.00 cm/s LVOT Vmean:        67.300 cm/s LVOT VTI:          0.205 m LVOT/AV VTI ratio: 0.66  AORTA Ao Root diam: 3.10 cm MITRAL VALVE MV Area (PHT): 2.57 cm     SHUNTS MV Area VTI:   2.97 cm     Systemic VTI:  0.20 m MV Peak grad:  5.8 mmHg     Systemic Diam: 2.10 cm MV Mean grad:  2.0 mmHg MV Vmax:       1.20 m/s MV Vmean:      64.1 cm/s MV Decel Time: 295 msec MV E velocity: 49.30 cm/s MV A velocity: 110.00 cm/s MV E/A ratio:  0.45 Julien Nordmann MD Electronically signed by Julien Nordmann MD Signature Date/Time: 10/16/2022/2:21:27 PM    Final    US Carotid Bilateral  Result Date: 10/16/2022 CLINICAL DATA:  Syncopal episode. History of hypertension, hyperlipidemia and diabetes. EXAM: BILATERAL CAROTID DUPLEX ULTRASOUND TECHNIQUE: Wallace Cullens scale imaging, color Doppler and duplex ultrasound were performed of bilateral carotid and vertebral arteries in the neck. COMPARISON:  None Available. FINDINGS: Criteria: Quantification of carotid stenosis is based on velocity parameters that correlate the residual internal carotid diameter with NASCET-based stenosis levels, using the diameter of the distal internal carotid lumen as the denominator for stenosis measurement. The following velocity measurements were obtained: RIGHT ICA: 118/30 cm/sec CCA: 99/19 cm/sec SYSTOLIC ICA/CCA RATIO:  1.3 ECA: 112 cm/sec LEFT ICA: 111/26 cm/sec CCA: 133/21 cm/sec SYSTOLIC ICA/CCA RATIO:  1.2 ECA: 135 cm/sec RIGHT  CAROTID ARTERY: There is a minimal amount of eccentric echogenic plaque within the right carotid bulb (image 18), extending to involve the origin and proximal aspects of the right internal carotid artery (image 24), not resulting in elevated peak systolic velocities within the interrogated course of the right internal carotid artery to suggest a hemodynamically significant stenosis. RIGHT VERTEBRAL ARTERY:  Antegrade flow LEFT CAROTID ARTERY: There is a minimal amount of scattered eccentric echogenic plaque throughout the left common carotid artery (representative image 45). There is a minimal amount of eccentric echogenic partially shadowing plaque within left carotid bulb (image 55), extending to involve the origin and proximal aspects of the left internal carotid artery (image 63), not resulting in elevated peak systolic velocities within the interrogated course of the left internal carotid artery to suggest a hemodynamically significant stenosis. LEFT VERTEBRAL ARTERY:  Antegrade flow IMPRESSION: Minimal amount of bilateral atherosclerotic plaque, left-greater-than-right, not resulting in a hemodynamically significant stenosis within either internal carotid artery. Electronically Signed   By: Simonne Come M.D.   On: 10/16/2022 11:16   MR BRAIN WO CONTRAST  Result Date: 10/15/2022 EXAM: MRI HEAD WITHOUT CONTRAST TECHNIQUE: Multiplanar, multiecho pulse sequences of the brain and surrounding structures were obtained without intravenous contrast. COMPARISON:  CT head from today. FINDINGS: Brain: Punctate acute infarct in the periventricular right frontal parietal region. No edema or mass effect. No evidence of acute hemorrhage, mass lesion, midline shift or hydrocephalus. Vascular: Major arterial flow voids are maintained. Skull and upper cervical spine: Normal marrow signal. Sinuses/Orbits: Mild paranasal sinus mucosal thickening. Other: No mastoid effusions. IMPRESSION: Punctate acute infarct in the  periventricular right frontoparietal region. Electronically Signed   By: Feliberto Harts M.D.   On: 10/15/2022 20:18   CT Head Wo Contrast  Result Date: 10/15/2022 CLINICAL DATA:  Provided history: Head  trauma, intracranial venous injury suspected. EXAM: CT HEAD WITHOUT CONTRAST TECHNIQUE: Contiguous axial images were obtained from the base of the skull through the vertex without intravenous contrast. RADIATION DOSE REDUCTION: This exam was performed according to the departmental dose-optimization program which includes automated exposure control, adjustment of the mA and/or kV according to patient size and/or use of iterative reconstruction technique. COMPARISON:  Head CT 09/01/2020. FINDINGS: Brain: No age advanced or lobar predominant parenchymal atrophy. Patchy and ill-defined hypoattenuation within the cerebral white matter, nonspecific but compatible with mild chronic small vessel ischemic disease. Prominent perivascular space within the inferior left basal ganglia. Partially empty sella turcica. There is no acute intracranial hemorrhage. No demarcated cortical infarct. No extra-axial fluid collection. No evidence of an intracranial mass. No midline shift. Vascular: No hyperdense vessel.  Atherosclerotic calcifications. Skull: No calvarial fracture or aggressive osseous lesion. Sinuses/Orbits: No mass or acute finding within the imaged orbits. No significant paranasal sinus disease at the imaged levels. IMPRESSION: 1.  No evidence of an acute intracranial abnormality. 2. Mild chronic small vessel ischemic changes within the cerebral white matter. Electronically Signed   By: Jackey Loge D.O.   On: 10/15/2022 15:01    Microbiology: Results for orders placed or performed during the hospital encounter of 09/01/20  Resp Panel by RT-PCR (Flu A&B, Covid) Nasopharyngeal Swab     Status: None   Collection Time: 09/01/20  8:46 PM   Specimen: Nasopharyngeal Swab; Nasopharyngeal(NP) swabs in vial transport  medium  Result Value Ref Range Status   SARS Coronavirus 2 by RT PCR NEGATIVE NEGATIVE Final    Comment: (NOTE) SARS-CoV-2 target nucleic acids are NOT DETECTED.  The SARS-CoV-2 RNA is generally detectable in upper respiratory specimens during the acute phase of infection. The lowest concentration of SARS-CoV-2 viral copies this assay can detect is 138 copies/mL. A negative result does not preclude SARS-Cov-2 infection and should not be used as the sole basis for treatment or other patient management decisions. A negative result may occur with  improper specimen collection/handling, submission of specimen other than nasopharyngeal swab, presence of viral mutation(s) within the areas targeted by this assay, and inadequate number of viral copies(<138 copies/mL). A negative result must be combined with clinical observations, patient history, and epidemiological information. The expected result is Negative.  Fact Sheet for Patients:  BloggerCourse.com  Fact Sheet for Healthcare Providers:  SeriousBroker.it  This test is no t yet approved or cleared by the [place] FDA and  has been authorized for detection and/or diagnosis of SARS-CoV-2 by FDA under an Emergency Use Authorization (EUA). This EUA will remain  in effect (meaning this test can be used) for the duration of the COVID-19 declaration under Section 564(b)(1) of the Act, 21 U.S.C.section 360bbb-3(b)(1), unless the authorization is terminated  or revoked sooner.       Influenza A by PCR NEGATIVE NEGATIVE Final   Influenza B by PCR NEGATIVE NEGATIVE Final    Comment: (NOTE) The Xpert Xpress SARS-CoV-2/FLU/RSV plus assay is intended as an aid in the diagnosis of influenza from Nasopharyngeal swab specimens and should not be used as a sole basis for treatment. Nasal washings and aspirates are unacceptable for Xpert Xpress SARS-CoV-2/FLU/RSV testing.  Fact Sheet for  Patients: BloggerCourse.com  Fact Sheet for Healthcare Providers: SeriousBroker.it  This test is not yet approved or cleared by the [place] FDA and has been authorized for detection and/or diagnosis of SARS-CoV-2 by FDA under an Emergency Use Authorization (EUA). This EUA will remain in effect (  meaning this test can be used) for the duration of the COVID-19 declaration under Section 564(b)(1) of the Act, 21 U.S.C. section 360bbb-3(b)(1), unless the authorization is terminated or revoked.  Performed at [facility], [address] [place], [place] [address]   Culture, blood (routine x 2)     Status: None   Collection Time: 09/01/20  8:46 PM   Specimen: BLOOD  Result Value Ref Range Status   Specimen Description BLOOD  RIGHT ARM  Final   Special Requests   Final    BOTTLES DRAWN AEROBIC AND ANAEROBIC Blood Culture adequate volume   Culture   Final    NO GROWTH 5 DAYS Performed at [facility], [address], [place], [place] [address]    Report Status 09/06/2020 FINAL  Final  Culture, blood (routine x 2)     Status: None   Collection Time: 09/01/20  8:46 PM   Specimen: BLOOD  Result Value Ref Range Status   Specimen Description BLOOD LEFT ARM  Final   Special Requests   Final    BOTTLES DRAWN AEROBIC AND ANAEROBIC Blood Culture results may not be optimal due to an inadequate volume of blood received in culture bottles   Culture   Final    NO GROWTH 5 DAYS Performed at [facility], [address] [place], [place] [address]    Report Status 09/06/2020 FINAL  Final  MRSA PCR Screening     Status: None   Collection Time: 09/02/20  2:47 AM   Specimen: Nasopharyngeal  Result Value Ref Range Status   MRSA by PCR NEGATIVE NEGATIVE Final    Comment:        The GeneXpert MRSA Assay (FDA approved for NASAL specimens only), is one component of a comprehensive MRSA colonization surveillance  program. It is not intended to diagnose MRSA infection nor to guide or monitor treatment for MRSA infections. Performed at [facility], [address] [place], [place] [address]     Labs: CBC: Recent Labs  Lab 10/15/22 1105 10/16/22 0421  WBC 6.6 7.1  HGB 13.7 13.1  HCT 43.0 40.9  MCV 90.7 89.7  PLT 234 225   Basic Metabolic Panel: Recent Labs  Lab 10/15/22 1105 10/16/22 0421  NA 136 138  K 4.1 4.0  CL 102 106  CO2 24 24  GLUCOSE 179* 124*  BUN 21 25*  CREATININE 1.59* 1.38*  CALCIUM 9.5 9.3   Liver Function Tests: No results for input(s): "AST", "ALT", "ALKPHOS", "BILITOT", "PROT", "ALBUMIN" in the last 168 hours. CBG: Recent Labs  Lab 10/15/22 1708 10/15/22 2049 10/16/22 0723 10/16/22 1145 10/16/22 1631  GLUCAP 108* 130* 147* 136* 126*    Discharge time spent: greater than 30 minutes.  Signed: Alford Highland, MD [facility] 10/16/2022

## 2022-10-16 NOTE — Assessment & Plan Note (Signed)
Troponin slightly elevated with stroke but trending down.  No complaints of chest pain.

## 2022-10-16 NOTE — Progress Notes (Signed)
*  PRELIMINARY RESULTS* Echocardiogram 2D Echocardiogram has been performed.  Allison Rogers 10/16/2022, 9:39 AM

## 2022-10-16 NOTE — Progress Notes (Signed)
Progress Note   Patient: Allison Rogers WJX:[id number] DOB: Dec 17, 1934 DOA: 10/15/2022     0 DOS: the patient was seen and examined on 10/16/2022   Brief hospital course: 88 y.o. female with medical history significant of CKD stage IIIb, associated anemia of chronic renal disease, type 2 diabetes, hypertension, osteoporosis, who presents to the ED due to fatigue.   Allison Rogers states that she was in her usual state of health this morning when she went on a shopping trip with her daughters.  After arrival to the store, her daughters tried to wake her up and she was unable to be awoken.  Her daughter states that she tried slapping her hands and rubbing her chest without any improvement.  They noted that Allison Rogers was breathing erratically and her eyes seem to have rolled back.  EMS was called and patient was able to be awoken.  After awakening, she was in her usual state of health without any complaints.  At the time, she only complained of feeling tired.   Patient's daughters state that this has happened in the past before. This episode was different as it was much harder to wake patient up and took much longer.     Allison Rogers endorses increased urination over the last 2 days, but otherwise denies any fever, chills, nausea, vomiting, diarrhea, chest pain, shortness of breath, lower extremity swelling or any focal weakness.  No jerking of the limbs seen while unconscious.   Family notes that patient has a poor appetite for the last 1 year and they believe she has lost some weight.  7/6.  Patient feeling better.  Feeling stronger.  Did well with PT.  Found to have a stroke on MRI last night.  Neurology ordered MRA of the head and neck.  Assessment and Plan: * Syncope Patient is presenting with concern for syncope that began as patient seeming asleep and then unable to wake up.  Previous episode in the past where patient was seen in the ED and felt to be due to hypovolemia.  I do not think this is  seizure with no bowel or bladder dysfunction.  Neurology did not believe this is secondary to stroke.  Could be secondary to medication patient does take tizanidine twice a day and also Tylenol PM at night.  Advised to get rid of Benadryl and taper tizanidine to off.  Acute stroke due to ischemia [facility]) Neurology to review MRA of the head and neck prior to making final recommendations.  Currently on aspirin and Plavix.  Essential hypertension Patient's daughter states that recently nephrology has removed medications.  Will check orthostatics prior to deciding on medications or not.   Type 2 diabetes mellitus (HCC) Hemoglobin A1c borderline at 6.4  Hypothyroidism Continue Synthroid.  TSH 4.428.  Chronic kidney disease, stage III (moderate) (HCC) Creatinine slightly improved to 1.38 with a GFR of 37.  Held Lasix today.  Myocardial injury Troponin slightly elevated with stroke but trending down.  No complaints of chest pain.        Subjective: Patient feeling better.  Had episodes where she was sleeping in the back of the car and could not be awoken.  MRI showing acute infarct.  Neurology believes that this could be secondary to high-dose tizanidine making her sleepy rather than the stroke.  Patient also takes Tylenol PM at night and I advised her to get rid of the Benadryl.  Physical Exam: Vitals:   10/16/22 0006 10/16/22 0419 10/16/22 0722 10/16/22 1145  BP: (!) 147/72 (!) 160/79 (!) 157/70 (!) 182/74  Pulse: 88 90 72 77  Resp: 18 18 18 18   Temp: 98.5 F (36.9 C) 98.7 F (37.1 C) 98 F (36.7 C) 98 F (36.7 C)  TempSrc:      SpO2: 96% 95% 99% 97%  Weight:      Height:       Physical Exam HENT:     Head: Normocephalic.     Mouth/Throat:     Pharynx: No oropharyngeal exudate.  Eyes:     General: Lids are normal.     Conjunctiva/sclera: Conjunctivae normal.  Cardiovascular:     Rate and Rhythm: Normal rate and regular rhythm.     Heart sounds: Normal heart sounds, S1  normal and S2 normal.  Pulmonary:     Breath sounds: No decreased breath sounds, wheezing, rhonchi or rales.  Abdominal:     Palpations: Abdomen is soft.     Tenderness: There is no abdominal tenderness.  Musculoskeletal:     Right lower leg: Swelling present.     Left lower leg: Swelling present.  Skin:    General: Skin is warm.     Findings: No rash.  Neurological:     Mental Status: She is alert.     Comments: Answers all questions appropriately.  Able to straight leg raise bilaterally.  Power 5 out of 5 upper and lower extremities.     Data Reviewed: Creatinine 1.38, LDL 109  Family Communication: Spoke with 4 family members at the bedside  Disposition: Status is: Observation Neurology ordered MRA of the head and neck.  May be able to go home this evening if we get these results read, if not tomorrow.  Planned Discharge Destination: Home    Time spent: 32 minutes Spoke with neurology  Author: Alford Highland, MD 10/16/2022 4:27 PM  For on call review [email or web address].

## 2022-10-16 NOTE — Discharge Instructions (Addendum)
Taper tizanidine to 2 mg nightly for 1 week then stop  Discontinue Benadryl  If any further altered mental status can consider discontinuing gabapentin (Neurontin)  Increase lipitor to 80 mg  Hold lasix (furosemide) for one week

## 2022-10-16 NOTE — Plan of Care (Signed)

## 2022-10-16 NOTE — Assessment & Plan Note (Addendum)
MRA head and neck negative and neurology recommends asa alone secondary to slight orthostatics.

## 2022-10-17 NOTE — TOC Progression Note (Signed)
Transition of Care [facility] 2) - Progression Note    Patient Details  Name: Allison Rogers MRN: [id number] Date of Birth: 10-23-1934  Transition of Care [facility]) CM/SW Contact  Bing Quarry, RN Phone Number: 10/17/2022, 9:04 AM  Clinical Narrative:  7/7: Late entry. On 10/16/22: RN CM was notified after work hours that patient was being discharged at 1845. RN CM had tried to reach out to patient but was in MRI. RNCM spoke to son in patient room at 415 pm on 10/16/22 while patient was in MRI regarding HH but he asked that RN CM call back to speak to patient directly. HH Orders are in chart at time of discharge. Informed RN that contacted this RN CM that TOC would reach out to patient this am to set up [facility]. Will update in chart if agency secured or will refer patient or family if permissible to contact PCP to get [facility] set up for PT/OT per orders. Patient was oriented and able to make decisions on 10/16/22, .   Contacts:  Home phone: [phone] Miquela Turrell: Daughter, [phone]  Joycelyn Das: Daughter, [phone] Gabriel Cirri MSN RN CM  Transitions of Care Department [facility] [phone] Weekends Only   UPDATE: [phone] am. RN CM was able to reach patient at home. Patient had not had HH before and had no agency preferences. RN CM explained it may be dependent on insurance issues. Patient has transportation to dr appointments, no issues obtaining medications so far, has a RW and a Cane at home, and has a ramp to assist with entry into home. She was agreeable to RN CM securing [facility] agency for [facility] PT/OT. RN CM will call patient back when/if HH agency secured. Patient stated she will call PCP Monday am to make a follow up appointment. Gabriel Cirri MSN RN CM  Transitions of Care Department [facility] [phone] Weekends Only   230 pm: Provider unable to renew order for [facility] that was auto deleted after midnight. Will update patient to contact PCP for assistance with [facility] set up.  Gabriel Cirri MSN RN CM   Transitions of Care Department [facility] [phone] Weekends Only       Expected Discharge Plan and Services         Expected Discharge Date: 10/16/22                                     Social Determinants of Health (SDOH) Interventions SDOH Screenings   Food Insecurity: No Food Insecurity (10/15/2022)  Housing: Patient Declined (10/15/2022)  Transportation Needs: No Transportation Needs (10/15/2022)  Utilities: Not At Risk (10/15/2022)  Tobacco Use: Low Risk  (10/15/2022)    Readmission Risk Interventions     No data to display

## 2023-05-17 ENCOUNTER — Ambulatory Visit
Admission: EM | Admit: 2023-05-17 | Discharge: 2023-05-17 | Disposition: A | Discharge status: Home / Self Care | Payer: Medicare Other | Attending: Emergency Medicine | Admitting: Emergency Medicine

## 2023-05-17 DIAGNOSIS — E876 Hypokalemia: Secondary | ICD-10-CM | POA: Insufficient documentation

## 2023-05-17 DIAGNOSIS — N189 Chronic kidney disease, unspecified: Secondary | ICD-10-CM | POA: Diagnosis present

## 2023-05-17 DIAGNOSIS — J101 Influenza due to other identified influenza virus with other respiratory manifestations: Secondary | ICD-10-CM | POA: Diagnosis present

## 2023-05-17 LAB — BASIC METABOLIC PANEL
Anion gap: 9 (ref 5–15)
BUN: 18 mg/dL (ref 8–23)
CO2: 24 mmol/L (ref 22–32)
Calcium: 9 mg/dL (ref 8.9–10.3)
Chloride: 101 mmol/L (ref 98–111)
Creatinine, Ser: 1.95 mg/dL — ABNORMAL HIGH (ref 0.44–1.00)
GFR, Estimated: 25 mL/min — ABNORMAL LOW (ref >60)
Glucose, Bld: 160 mg/dL — ABNORMAL HIGH (ref 70–99)
Potassium: 3 mmol/L — ABNORMAL LOW (ref 3.5–5.1)
Sodium: 134 mmol/L — ABNORMAL LOW (ref 135–145)

## 2023-05-17 LAB — RESP PANEL BY RT-PCR (FLU A&B, COVID) ARPGX2
Influenza A by PCR: POSITIVE — AB
Influenza B by PCR: NEGATIVE
SARS Coronavirus 2 by RT PCR: NEGATIVE

## 2023-05-17 MED ORDER — ACETAMINOPHEN 325 MG PO TABS
975.0000 mg | ORAL_TABLET | Freq: Once | ORAL | Status: AC
  Administered 2023-05-17: 975 mg via ORAL

## 2023-05-17 MED ORDER — OSELTAMIVIR PHOSPHATE 75 MG PO CAPS: 75.0000 mg | ORAL_CAPSULE | Freq: Once | ORAL | 0 refills | Status: AC

## 2023-05-17 MED ORDER — BENZONATATE 200 MG PO CAPS: 200.0000 mg | ORAL_CAPSULE | Freq: Three times a day (TID) | ORAL | 0 refills | Status: AC | PRN

## 2023-05-17 MED ORDER — ALBUTEROL SULFATE HFA 108 (90 BASE) MCG/ACT IN AERS: 1.0000 | INHALATION_SPRAY | RESPIRATORY_TRACT | 0 refills | Status: AC | PRN

## 2023-05-17 MED ORDER — OSELTAMIVIR PHOSPHATE 30 MG PO CAPS: 30.0000 mg | ORAL_CAPSULE | Freq: Two times a day (BID) | ORAL | 0 refills | Status: AC

## 2023-05-17 MED ORDER — AEROCHAMBER MV MISC: 1 refills | Status: AC

## 2023-05-17 NOTE — ED Provider Notes (Addendum)
 HPI  SUBJECTIVE:  Allison Rogers is a 89 y.o. female who presents with 3 days of fevers Tmax of 102.2, dry cough, body aches, headaches, wheezing, shortness of breath, dyspnea on exertion.  Some diarrhea.  No nasal congestion, rhinorrhea, sore throat, nausea, vomiting, abdominal pain.  Unable to sleep at night because of the cough.  Recent exposure to influenza.  No antipyretic in the past 6 hours.  She tried DayQuil without improvement in her symptoms.  Symptoms are worse with exertion. Patient has a past medical history of hyperlipidemia, hypertension, hypothyroidism, diabetes, chronic kidney disease stage III, CVA, hypercholesterolemia.  PCP: [facility].  Past Medical History:  Diagnosis Date   Hyperlipidemia    Hypertension    Hypothyroidism    Type 2 diabetes mellitus (HCC)     History reviewed. No pertinent surgical history.  Family History  Problem Relation Age of Onset   Breast cancer Neg Hx     Social History   Tobacco Use   Smoking status: Never   Smokeless tobacco: Never    No current facility-administered medications for this encounter.  Current Outpatient Medications:    albuterol  (VENTOLIN  HFA) 108 (90 Base) MCG/ACT inhaler, Inhale 1-2 puffs into the lungs every 4 (four) hours as needed for wheezing or shortness of breath., Disp: 1 each, Rfl: 0   benzonatate  (TESSALON ) 200 MG capsule, Take 1 capsule (200 mg total) by mouth 3 (three) times daily as needed for cough., Disp: 30 capsule, Rfl: 0   oseltamivir  (TAMIFLU ) 30 MG capsule, Take 1 capsule (30 mg total) by mouth 2 (two) times daily for 4 days., Disp: 8 capsule, Rfl: 0   oseltamivir  (TAMIFLU ) 75 MG capsule, Take 1 capsule (75 mg total) by mouth once for 1 dose. X 5 days, Disp: 1 capsule, Rfl: 0   Spacer/Aero-Holding Chambers (AEROCHAMBER MV) inhaler, Use as instructed, Disp: 1 each, Rfl: 1   aspirin  81 MG EC tablet, Take 81 mg by mouth daily., Disp: , Rfl:    atorvastatin  (LIPITOR) 80 MG tablet, Take 1 tablet  (80 mg total) by mouth daily., Disp: 30 tablet, Rfl: 0   CALCIUM  600/VITAMIN D 600-400 MG-UNIT TABS, Take 1 tablet by mouth 2 (two) times daily., Disp: , Rfl:    cholecalciferol (VITAMIN D3) 25 MCG (1000 UNIT) tablet, Take 1,000 Units by mouth daily., Disp: , Rfl:    cyanocobalamin  1000 MCG tablet, Take 1 tablet (1,000 mcg total) by mouth daily., Disp: 30 tablet, Rfl: 0   gabapentin  (NEURONTIN ) 100 MG capsule, Take 100 mg by mouth 3 (three) times daily., Disp: , Rfl:    levothyroxine  (SYNTHROID ) 50 MCG tablet, Take 50 mcg by mouth daily., Disp: , Rfl:    loratadine (CLARITIN) 10 MG tablet, Take 10 mg by mouth daily., Disp: , Rfl:    magnesium  oxide (MAG-OX) 400 MG tablet, Take 400 mg by mouth 2 (two) times daily., Disp: , Rfl:    omeprazole (PRILOSEC) 20 MG capsule, Take 20 mg by mouth daily., Disp: , Rfl:    tiZANidine  (ZANAFLEX ) 2 MG tablet, One tab po twice a day for one week then one tab po nightly for one week then stop, Disp: 7 tablet, Rfl: 0   VOLTAREN 1 % GEL, Apply 2 g topically 2 (two) times daily as needed., Disp: , Rfl:   Allergies  Allergen Reactions   Lactose Intolerance (Gi)    Penicillins Itching and Rash     ROS  As noted in HPI.   Physical Exam  BP ROLLEN)  158/98 (BP Location: Left Arm)   Pulse (!) 105   Temp (!) 101 F (38.3 C) (Oral)   Resp 16   SpO2 94%  Today's Vitals   05/17/23 1831 05/17/23 1832 05/17/23 1910  BP:  (!) 158/98   Pulse:  (!) 105   Resp:  16   Temp: (!) 102.2 F (39 C) (!) 102.2 F (39 C) (!) 101 F (38.3 C)  TempSrc: Oral Oral Oral  SpO2:  94%    There is no height or weight on file to calculate BMI.  Constitutional: Well developed, well nourished, no acute distress Eyes: PERRL, EOMI, conjunctiva normal bilaterally HENT: Normocephalic, atraumatic,mucus membranes moist Respiratory: Faint occasional expiratory wheezing.  No rales, no rhonchi.  Good air movement.  Normal work of breathing. Cardiovascular: Regular tachycardia, no murmurs,  no gallops, no rubs GI: Nondistended skin: No rash, skin intact Musculoskeletal: no deformities Neurologic: Alert & oriented x 3, CN III-XII grossly intact, no motor deficits, sensation grossly intact Psychiatric: Speech and behavior appropriate   ED Course   Medications  acetaminophen  (TYLENOL ) tablet 975 mg (975 mg Oral Given 05/17/23 1835)    Orders Placed This Encounter  Procedures   Resp Panel by RT-PCR (Flu A&B, Covid) Anterior Nasal Swab    Standing Status:   Standing    Number of Occurrences:   1    Patient immune status:   Normal    Release to patient:   Immediate [1]   Basic metabolic panel    Standing Status:   Standing    Number of Occurrences:   1   Results for orders placed or performed during the hospital encounter of 05/17/23 (from the past 24 hours)  Resp Panel by RT-PCR (Flu A&B, Covid) Anterior Nasal Swab     Status: Abnormal   Collection Time: 05/17/23  6:33 PM   Specimen: Anterior Nasal Swab  Result Value Ref Range   SARS Coronavirus 2 by RT PCR NEGATIVE NEGATIVE   Influenza A by PCR POSITIVE (A) NEGATIVE   Influenza B by PCR NEGATIVE NEGATIVE  Basic metabolic panel     Status: Abnormal   Collection Time: 05/17/23  7:59 PM  Result Value Ref Range   Sodium 134 (L) 135 - 145 mmol/L   Potassium 3.0 (L) 3.5 - 5.1 mmol/L   Chloride 101 98 - 111 mmol/L   CO2 24 22 - 32 mmol/L   Glucose, Bld 160 (H) 70 - 99 mg/dL   BUN 18 8 - 23 mg/dL   Creatinine, Ser 8.04 (H) 0.44 - 1.00 mg/dL   Calcium  9.0 8.9 - 10.3 mg/dL   GFR, Estimated 25 (L) >60 mL/min   Anion gap 9 5 - 15   No results found.  ED Clinical Impression  1. Influenza A   2. Chronic renal impairment, unspecified CKD stage   3. Hypokalemia      ED Assessment/Plan      Influenza A positive.  Patient was given Tylenol  with fever reduction.  She has a past medical history of chronic kidney disease stage III, last basic metabolic panel done in July 2024, so we will check a BMP today to see if  we need to renally dose with Tamiflu .  We discussed getting a chest x-ray, but as it would not change management, as I think it would be an influenza pneumonia, so we have deferred this today.  She is in no respiratory distress and her O2 sat is acceptable at 94%.   Labs reviewed.  Hypokalemia.  Increased creatinine.  Calculated creatinine clearance 32 mL/min.  Will send home with renally dosed Tamiflu  75 mg for 1 dose and 30 mg twice a day per up-to-date recommendations.  Regularly scheduled albuterol  inhaler with a spacer, Tessalon .  Multivitamin/replace potassium with diet.  Follow-up with PCP in 1 to 2 days.  Strict ER return precautions given to family and patient.  2200-on further reflection, I think the patient has very little physiologic reserve and needs a comprehensive emergency room evaluation tonight to make sure that she is not septic or having impending respiratory failure.  I think she needs medications now, rather than tomorrow.  I was able to get in touch with the daughter and explained this to her.  I am quite concerned about the patient and I am worried that she may end up with acute respiratory failure.  I encouraged her daughter to take patient to an ER of her choice tonight.  She understands and agrees with plan.  Discussed labs, MDM, treatment plan, and plan for follow-up with patient and family.  Discussed sn/sx that should prompt return to the ED. They agree with with plan.   Meds ordered this encounter  Medications   acetaminophen  (TYLENOL ) tablet 975 mg   oseltamivir  (TAMIFLU ) 75 MG capsule    Sig: Take 1 capsule (75 mg total) by mouth once for 1 dose. X 5 days    Dispense:  1 capsule    Refill:  0   oseltamivir  (TAMIFLU ) 30 MG capsule    Sig: Take 1 capsule (30 mg total) by mouth 2 (two) times daily for 4 days.    Dispense:  8 capsule    Refill:  0   benzonatate  (TESSALON ) 200 MG capsule    Sig: Take 1 capsule (200 mg total) by mouth 3 (three) times daily as needed  for cough.    Dispense:  30 capsule    Refill:  0   albuterol  (VENTOLIN  HFA) 108 (90 Base) MCG/ACT inhaler    Sig: Inhale 1-2 puffs into the lungs every 4 (four) hours as needed for wheezing or shortness of breath.    Dispense:  1 each    Refill:  0   Spacer/Aero-Holding Chambers (AEROCHAMBER MV) inhaler    Sig: Use as instructed    Dispense:  1 each    Refill:  1      *This clinic note was created using Dragon dictation software. Therefore, there may be occasional mistakes despite careful proofreading. ?    Van Knee, MD 05/17/23 7853    Van Knee, MD 05/17/23 2202

## 2023-05-17 NOTE — ED Triage Notes (Signed)
Flu exposure   Weak Wheezing Non productive cough feverish

## 2023-05-17 NOTE — Discharge Instructions (Addendum)
 75 mg of Tamiflu  today.  Tomorrow start taking 30 mg twice a day for the next 4 days.  Tessalon  for cough.  2 puffs from your albuterol  inhaler using your spacer every 4 hours for 2 days, then every 6 hours for 2 days, then as needed.  Follow-up with your primary care provider in a few days.  Your creatinine has gotten worse compared to previous, and your potassium is a little low.  I would increase your intake of potassium rich foods and start a multivitamin.  Go immediately to the ER if you get worse or for any concerns.

## 2023-05-29 ENCOUNTER — Emergency Department: Payer: Medicare Other

## 2023-05-29 ENCOUNTER — Other Ambulatory Visit: Payer: Self-pay

## 2023-05-29 ENCOUNTER — Inpatient Hospital Stay
Admission: EM | Admit: 2023-05-29 | Discharge: 2023-06-01 | DRG: 194 | Disposition: A | Discharge status: Home / Self Care | Payer: Medicare Other | Attending: Internal Medicine | Admitting: Internal Medicine

## 2023-05-29 DIAGNOSIS — Z7982 Long term (current) use of aspirin: Secondary | ICD-10-CM | POA: Diagnosis not present

## 2023-05-29 DIAGNOSIS — J09X1 Influenza due to identified novel influenza A virus with pneumonia: Secondary | ICD-10-CM | POA: Diagnosis present

## 2023-05-29 DIAGNOSIS — Z880 Allergy status to penicillin: Secondary | ICD-10-CM

## 2023-05-29 DIAGNOSIS — Z79899 Other long term (current) drug therapy: Secondary | ICD-10-CM

## 2023-05-29 DIAGNOSIS — R079 Chest pain, unspecified: Secondary | ICD-10-CM | POA: Insufficient documentation

## 2023-05-29 DIAGNOSIS — E66812 Obesity, class 2: Secondary | ICD-10-CM | POA: Diagnosis present

## 2023-05-29 DIAGNOSIS — R531 Weakness: Secondary | ICD-10-CM

## 2023-05-29 DIAGNOSIS — N189 Chronic kidney disease, unspecified: Secondary | ICD-10-CM | POA: Diagnosis present

## 2023-05-29 DIAGNOSIS — Z79890 Hormone replacement therapy (postmenopausal): Secondary | ICD-10-CM

## 2023-05-29 DIAGNOSIS — E872 Acidosis, unspecified: Secondary | ICD-10-CM | POA: Diagnosis present

## 2023-05-29 DIAGNOSIS — E039 Hypothyroidism, unspecified: Secondary | ICD-10-CM | POA: Diagnosis present

## 2023-05-29 DIAGNOSIS — N179 Acute kidney failure, unspecified: Secondary | ICD-10-CM | POA: Diagnosis present

## 2023-05-29 DIAGNOSIS — E1169 Type 2 diabetes mellitus with other specified complication: Secondary | ICD-10-CM

## 2023-05-29 DIAGNOSIS — I10 Essential (primary) hypertension: Secondary | ICD-10-CM | POA: Diagnosis present

## 2023-05-29 DIAGNOSIS — R1310 Dysphagia, unspecified: Secondary | ICD-10-CM | POA: Diagnosis present

## 2023-05-29 DIAGNOSIS — Z6835 Body mass index (BMI) 35.0-35.9, adult: Secondary | ICD-10-CM

## 2023-05-29 DIAGNOSIS — Z1152 Encounter for screening for COVID-19: Secondary | ICD-10-CM

## 2023-05-29 DIAGNOSIS — E785 Hyperlipidemia, unspecified: Secondary | ICD-10-CM | POA: Diagnosis present

## 2023-05-29 DIAGNOSIS — E739 Lactose intolerance, unspecified: Secondary | ICD-10-CM | POA: Diagnosis present

## 2023-05-29 DIAGNOSIS — J1000 Influenza due to other identified influenza virus with unspecified type of pneumonia: Secondary | ICD-10-CM | POA: Diagnosis present

## 2023-05-29 DIAGNOSIS — E1122 Type 2 diabetes mellitus with diabetic chronic kidney disease: Secondary | ICD-10-CM | POA: Diagnosis present

## 2023-05-29 DIAGNOSIS — E119 Type 2 diabetes mellitus without complications: Secondary | ICD-10-CM

## 2023-05-29 DIAGNOSIS — J189 Pneumonia, unspecified organism: Principal | ICD-10-CM

## 2023-05-29 DIAGNOSIS — I129 Hypertensive chronic kidney disease with stage 1 through stage 4 chronic kidney disease, or unspecified chronic kidney disease: Secondary | ICD-10-CM | POA: Diagnosis present

## 2023-05-29 LAB — COMPREHENSIVE METABOLIC PANEL
ALT: 28 U/L (ref 0–44)
AST: 47 U/L — ABNORMAL HIGH (ref 15–41)
Albumin: 2.9 g/dL — ABNORMAL LOW (ref 3.5–5.0)
Alkaline Phosphatase: 63 U/L (ref 38–126)
Anion gap: 15 (ref 5–15)
BUN: 17 mg/dL (ref 8–23)
CO2: 17 mmol/L — ABNORMAL LOW (ref 22–32)
Calcium: 8.8 mg/dL — ABNORMAL LOW (ref 8.9–10.3)
Chloride: 105 mmol/L (ref 98–111)
Creatinine, Ser: 1.32 mg/dL — ABNORMAL HIGH (ref 0.44–1.00)
GFR, Estimated: 39 mL/min — ABNORMAL LOW (ref >60)
Glucose, Bld: 154 mg/dL — ABNORMAL HIGH (ref 70–99)
Potassium: 3.1 mmol/L — ABNORMAL LOW (ref 3.5–5.1)
Sodium: 137 mmol/L (ref 135–145)
Total Bilirubin: 2 mg/dL — ABNORMAL HIGH (ref 0.0–1.2)
Total Protein: 7 g/dL (ref 6.5–8.1)

## 2023-05-29 LAB — CBC WITH DIFFERENTIAL/PLATELET
Abs Immature Granulocytes: 0.09 10*3/uL — ABNORMAL HIGH (ref 0.00–0.07)
Basophils Absolute: 0.1 10*3/uL (ref 0.0–0.1)
Basophils Relative: 0 %
Eosinophils Absolute: 0 10*3/uL (ref 0.0–0.5)
Eosinophils Relative: 0 %
HCT: 42.6 % (ref 36.0–46.0)
Hemoglobin: 13.5 g/dL (ref 12.0–15.0)
Immature Granulocytes: 1 %
Lymphocytes Relative: 19 %
Lymphs Abs: 2.5 10*3/uL (ref 0.7–4.0)
MCH: 29.2 pg (ref 26.0–34.0)
MCHC: 31.7 g/dL (ref 30.0–36.0)
MCV: 92.2 fL (ref 80.0–100.0)
Monocytes Absolute: 1.3 10*3/uL — ABNORMAL HIGH (ref 0.1–1.0)
Monocytes Relative: 10 %
Neutro Abs: 9 10*3/uL — ABNORMAL HIGH (ref 1.7–7.7)
Neutrophils Relative %: 70 %
Platelets: 217 10*3/uL (ref 150–400)
RBC: 4.62 MIL/uL (ref 3.87–5.11)
RDW: 13.7 % (ref 11.5–15.5)
WBC: 12.9 10*3/uL — ABNORMAL HIGH (ref 4.0–10.5)
nRBC: 0 % (ref 0.0–0.2)

## 2023-05-29 LAB — LACTIC ACID, PLASMA: Lactic Acid, Venous: 1.8 mmol/L (ref 0.5–1.9)

## 2023-05-29 LAB — URINALYSIS, W/ REFLEX TO CULTURE (INFECTION SUSPECTED)
Bacteria, UA: NONE SEEN
Bilirubin Urine: NEGATIVE
Glucose, UA: NEGATIVE mg/dL
Hgb urine dipstick: NEGATIVE
Ketones, ur: NEGATIVE mg/dL
Leukocytes,Ua: NEGATIVE
Nitrite: NEGATIVE
Protein, ur: 100 mg/dL — AB
Specific Gravity, Urine: 1.026 (ref 1.005–1.030)
pH: 6 (ref 5.0–8.0)

## 2023-05-29 LAB — TROPONIN I (HIGH SENSITIVITY)
Troponin I (High Sensitivity): 41 ng/L — ABNORMAL HIGH (ref <18)
Troponin I (High Sensitivity): 43 ng/L — ABNORMAL HIGH (ref <18)

## 2023-05-29 LAB — HEMOGLOBIN A1C
Hgb A1c MFr Bld: 6.3 % — ABNORMAL HIGH (ref 4.8–5.6)
Mean Plasma Glucose: 134.11 mg/dL

## 2023-05-29 LAB — PROCALCITONIN: Procalcitonin: 0.24 ng/mL

## 2023-05-29 LAB — RESP PANEL BY RT-PCR (RSV, FLU A&B, COVID)  RVPGX2
Influenza A by PCR: POSITIVE — AB
Influenza B by PCR: NEGATIVE
Resp Syncytial Virus by PCR: NEGATIVE
SARS Coronavirus 2 by RT PCR: NEGATIVE

## 2023-05-29 LAB — CBG MONITORING, ED
Glucose-Capillary: 156 mg/dL — ABNORMAL HIGH (ref 70–99)
Glucose-Capillary: 166 mg/dL — ABNORMAL HIGH (ref 70–99)

## 2023-05-29 MED ORDER — GUAIFENESIN-DM 100-10 MG/5ML PO SYRP
5.0000 mL | ORAL_SOLUTION | ORAL | Status: DC | PRN
  Administered 2023-05-29 – 2023-05-31 (×3): 5 mL via ORAL
  Filled 2023-05-29 (×3): qty 10

## 2023-05-29 MED ORDER — SODIUM CHLORIDE 0.9 % IV SOLN: INTRAVENOUS | Status: AC

## 2023-05-29 MED ORDER — INSULIN ASPART 100 UNIT/ML IJ SOLN
0.0000 [IU] | Freq: Three times a day (TID) | INTRAMUSCULAR | Status: DC
  Administered 2023-05-29 – 2023-05-30 (×3): 2 [IU] via SUBCUTANEOUS
  Administered 2023-05-31 – 2023-06-01 (×3): 1 [IU] via SUBCUTANEOUS
  Administered 2023-06-01: 2 [IU] via SUBCUTANEOUS
  Filled 2023-05-29 (×7): qty 1

## 2023-05-29 MED ORDER — IPRATROPIUM-ALBUTEROL 0.5-2.5 (3) MG/3ML IN SOLN
3.0000 mL | RESPIRATORY_TRACT | Status: DC | PRN
  Administered 2023-05-29 (×2): 3 mL via RESPIRATORY_TRACT
  Filled 2023-05-29 (×2): qty 3

## 2023-05-29 MED ORDER — AZITHROMYCIN 500 MG IV SOLR
500.0000 mg | Freq: Once | INTRAVENOUS | Status: AC
  Administered 2023-05-29: 500 mg via INTRAVENOUS
  Filled 2023-05-29: qty 5

## 2023-05-29 MED ORDER — SODIUM CHLORIDE 0.9 % IV SOLN
2.0000 g | Freq: Once | INTRAVENOUS | Status: AC
  Administered 2023-05-29: 2 g via INTRAVENOUS
  Filled 2023-05-29: qty 20

## 2023-05-29 MED ORDER — ONDANSETRON HCL 4 MG PO TABS: 4.0000 mg | ORAL_TABLET | Freq: Four times a day (QID) | ORAL | Status: DC | PRN

## 2023-05-29 MED ORDER — POTASSIUM CHLORIDE CRYS ER 20 MEQ PO TBCR
40.0000 meq | EXTENDED_RELEASE_TABLET | Freq: Once | ORAL | Status: AC
  Administered 2023-05-29: 40 meq via ORAL
  Filled 2023-05-29: qty 2

## 2023-05-29 MED ORDER — ENOXAPARIN SODIUM 60 MG/0.6ML IJ SOSY
45.0000 mg | PREFILLED_SYRINGE | INTRAMUSCULAR | Status: DC
Start: 2023-05-29 — End: 2023-06-01
  Administered 2023-05-29 – 2023-05-31 (×3): 45 mg via SUBCUTANEOUS
  Filled 2023-05-29 (×3): qty 0.6

## 2023-05-29 MED ORDER — SODIUM CHLORIDE 0.9 % IV BOLUS (SEPSIS)
1000.0000 mL | Freq: Once | INTRAVENOUS | Status: AC
  Administered 2023-05-29: 1000 mL via INTRAVENOUS

## 2023-05-29 MED ORDER — ONDANSETRON HCL 4 MG/2ML IJ SOLN: 4.0000 mg | Freq: Four times a day (QID) | INTRAMUSCULAR | Status: DC | PRN

## 2023-05-29 MED ORDER — BENZONATATE 100 MG PO CAPS
200.0000 mg | ORAL_CAPSULE | Freq: Three times a day (TID) | ORAL | Status: DC | PRN
  Administered 2023-05-29: 200 mg via ORAL
  Filled 2023-05-29: qty 2

## 2023-05-29 MED ORDER — SODIUM CHLORIDE 0.9 % IV SOLN
1.0000 g | INTRAVENOUS | Status: DC
  Administered 2023-05-30 – 2023-06-01 (×3): 1 g via INTRAVENOUS
  Filled 2023-05-29 (×3): qty 10

## 2023-05-29 MED ORDER — SODIUM CHLORIDE 0.9 % IV SOLN
500.0000 mg | INTRAVENOUS | Status: DC
  Administered 2023-05-30: 500 mg via INTRAVENOUS
  Filled 2023-05-29: qty 5

## 2023-05-29 MED ORDER — SODIUM CHLORIDE 0.9 % IV BOLUS
500.0000 mL | Freq: Once | INTRAVENOUS | Status: AC
  Administered 2023-05-29: 500 mL via INTRAVENOUS

## 2023-05-29 NOTE — Assessment & Plan Note (Signed)
 Influenza A roughly one week ago w/ progressively worsening resp status at home- now w/ superimposed RML PNA on CXR  S/p course of tamiflu  IV rocephin and azithromycin for infectious coverage  Blood and resp cultures  No hypoxia at present- monitor  Follow closely

## 2023-05-29 NOTE — ED Provider Notes (Signed)
 [facility] Provider Note    Event Date/Time   First MD Initiated Contact with Patient 05/29/23 0421     (approximate)   History   Shortness of Breath   HPI  Allison Rogers is a 89 y.o. female with history of hypertension, diabetes, hyperlipidemia, hypothyroidism, chronic kidney disease who presents to the emergency department with cough, shortness of breath, generalized weakness.  Family reports decreased oral intake.  Was diagnosed with influenza 2 weeks ago and finished a course of Tamiflu.  States symptoms seem to have worsened instead of improved.  No longer having fevers.  No vomiting, diarrhea.  She denies chest or abdominal pain, headache.   History provided by patient, family.    Past Medical History:  Diagnosis Date   Hyperlipidemia    Hypertension    Hypothyroidism    Type 2 diabetes mellitus (HCC)     History reviewed. No pertinent surgical history.  MEDICATIONS:  Prior to Admission medications   Medication Sig Start Date End Date Taking? Authorizing Provider  albuterol (VENTOLIN HFA) 108 (90 Base) MCG/ACT inhaler Inhale 1-2 puffs into the lungs every 4 (four) hours as needed for wheezing or shortness of breath. 05/17/23   Domenick Gong, MD  aspirin 81 MG EC tablet Take 81 mg by mouth daily.    [provider]  atorvastatin (LIPITOR) 80 MG tablet Take 1 tablet (80 mg total) by mouth daily. 10/16/22 10/16/23  Alford Highland, MD  benzonatate (TESSALON) 200 MG capsule Take 1 capsule (200 mg total) by mouth 3 (three) times daily as needed for cough. 05/17/23   Domenick Gong, MD  CALCIUM 600/VITAMIN D 600-400 MG-UNIT TABS Take 1 tablet by mouth 2 (two) times daily. 06/03/20   [provider]  cholecalciferol (VITAMIN D3) 25 MCG (1000 UNIT) tablet Take 1,000 Units by mouth daily.    [provider]  cyanocobalamin 1000 MCG tablet Take 1 tablet (1,000 mcg total) by mouth daily. 10/17/22   Alford Highland, MD   gabapentin (NEURONTIN) 100 MG capsule Take 100 mg by mouth 3 (three) times daily. 03/15/17   [provider]  levothyroxine (SYNTHROID) 50 MCG tablet Take 50 mcg by mouth daily. 03/13/20   [provider]  loratadine (CLARITIN) 10 MG tablet Take 10 mg by mouth daily. 08/02/22   [provider]  magnesium oxide (MAG-OX) 400 MG tablet Take 400 mg by mouth 2 (two) times daily.    [provider]  omeprazole (PRILOSEC) 20 MG capsule Take 20 mg by mouth daily. 08/31/22   [provider]  Spacer/Aero-Holding Deretha Emory (AEROCHAMBER MV) inhaler Use as instructed 05/17/23   Domenick Gong, MD  tiZANidine (ZANAFLEX) 2 MG tablet One tab po twice a day for one week then one tab po nightly for one week then stop 10/16/22   Alford Highland, MD  VOLTAREN 1 % GEL Apply 2 g topically 2 (two) times daily as needed. 03/28/20   [provider]    Physical Exam   Triage Vital Signs: ED Triage Vitals  Encounter Vitals Group     BP 05/29/23 0404 (!) 155/81     Systolic BP Percentile --      Diastolic BP Percentile --      Pulse Rate 05/29/23 0404 77     Resp 05/29/23 0404 20     Temp 05/29/23 0404 98.2 F (36.8 C)     Temp Source 05/29/23 0404 Oral     SpO2 05/29/23 0404 95 %  Weight --      Height 05/29/23 0405 5\' 5"  (1.651 m)     Head Circumference --      Peak Flow --      Pain Score 05/29/23 0405 0     Pain Loc --      Pain Education --      Exclude from Growth Chart --     Most recent vital signs: Vitals:   05/29/23 0404  BP: (!) 155/81  Pulse: 77  Resp: 20  Temp: 98.2 F (36.8 C)  SpO2: 95%    CONSTITUTIONAL: Alert, responds appropriately to questions.  Elderly, nontoxic in appearance, in no distress, recurrent cough that is nonproductive HEAD: Normocephalic, atraumatic EYES: Conjunctivae clear, pupils appear equal, sclera nonicteric ENT: normal nose; moist mucous membranes NECK: Supple, normal ROM CARD: RRR; S1 and S2  appreciated RESP: Normal chest excursion without splinting or tachypnea; breath sounds clear and equal bilaterally; no wheezes, no rhonchi, no rales, no hypoxia or respiratory distress, speaking full sentences ABD/GI: Non-distended; soft, non-tender, no rebound, no guarding, no peritoneal signs BACK: The back appears normal EXT: Normal ROM in all joints; no deformity noted, no edema, no calf tenderness or calf swelling SKIN: Normal color for age and race; warm; no rash on exposed skin NEURO: Moves all extremities equally, normal speech PSYCH: The patient's mood and manner are appropriate.   ED Results / Procedures / Treatments   LABS: (all labs ordered are listed, but only abnormal results are displayed) Labs Reviewed  RESP PANEL BY RT-PCR (RSV, FLU A&B, COVID)  RVPGX2 - Abnormal; Notable for the following components:      Result Value   Influenza A by PCR POSITIVE (*)    All other components within normal limits  COMPREHENSIVE METABOLIC PANEL - Abnormal; Notable for the following components:   Potassium 3.1 (*)    CO2 17 (*)    Glucose, Bld 154 (*)    Creatinine, Ser 1.32 (*)    Calcium 8.8 (*)    Albumin 2.9 (*)    AST 47 (*)    Total Bilirubin 2.0 (*)    GFR, Estimated 39 (*)    All other components within normal limits  CBC WITH DIFFERENTIAL/PLATELET - Abnormal; Notable for the following components:   WBC 12.9 (*)    Neutro Abs 9.0 (*)    Monocytes Absolute 1.3 (*)    Abs Immature Granulocytes 0.09 (*)    All other components within normal limits  CULTURE, BLOOD (ROUTINE X 2)  CULTURE, BLOOD (ROUTINE X 2)  LACTIC ACID, PLASMA  URINALYSIS, W/ REFLEX TO CULTURE (INFECTION SUSPECTED)  PROCALCITONIN  TROPONIN I (HIGH SENSITIVITY)     EKG:  EKG Interpretation Date/Time:  Sunday May 29 2023 04:07:38 EST Ventricular Rate:  78 PR Interval:  166 QRS Duration:  104 QT Interval:  372 QTC Calculation: 424 R Axis:   53  Text Interpretation: Normal sinus rhythm Left  ventricular hypertrophy with repolarization abnormality ( Sokolow-Lyon , Cornell product ) Abnormal ECG When compared with ECG of 15-Oct-2022 11:08, Minimal criteria for Septal infarct are no longer Present Nonspecific T wave abnormality has replaced inverted T waves in Inferior leads Confirmed by Rochele Raring [phone]) on 05/29/2023 4:21:42 AM         RADIOLOGY: My personal review and interpretation of imaging: Chest x-ray shows right middle lobe pneumonia.  I have personally reviewed all radiology reports.   DG Chest 2 View Result Date: 05/29/2023 CLINICAL DATA:  Shortness of breath.  Recent diagnosis of the flu. EXAM: CHEST - 2 VIEW COMPARISON:  09/01/2020 FINDINGS: Airspace disease in the right middle lobe. No edema, effusion, or pneumothorax. Normal heart size and mediastinal contours. IMPRESSION: Right middle lobe pneumonia. Followup PA and lateral chest X-ray is recommended in 3-4 weeks following trial of antibiotic therapy to ensure resolution. Electronically Signed   By: Tiburcio Pea M.D.   On: 05/29/2023 05:07     PROCEDURES:  Critical Care performed: Yes, see critical care procedure note(s)   CRITICAL CARE Performed by: Baxter Hire Lynita Groseclose   Total critical care time: 40 minutes  Critical care time was exclusive of separately billable procedures and treating other patients.  Critical care was necessary to treat or prevent imminent or life-threatening deterioration.  Critical care was time spent personally by me on the following activities: development of treatment plan with patient and/or surrogate as well as nursing, discussions with consultants, evaluation of patient's response to treatment, examination of patient, obtaining history from patient or surrogate, ordering and performing treatments and interventions, ordering and review of laboratory studies, ordering and review of radiographic studies, pulse oximetry and re-evaluation of patient's  condition.   Procedures    IMPRESSION / MDM / ASSESSMENT AND PLAN / ED COURSE  I reviewed the triage vital signs and the nursing notes.    Patient here with cough, congestion, decreased oral intake, generalized weakness.  The patient is on the cardiac monitor to evaluate for evidence of arrhythmia and/or significant heart rate changes.   DIFFERENTIAL DIAGNOSIS (includes but not limited to):   Viral URI, pneumonia, UTI, dehydration, anemia, electrolyte derangement, thyroid dysfunction, ACS, arrhythmia, PE   Patient's presentation is most consistent with acute presentation with potential threat to life or bodily function.   PLAN: Will obtain labs, chest x-ray, COVID and flu swab, urine.  Will give IV fluids.   MEDICATIONS GIVEN IN ED: Medications  cefTRIAXone (ROCEPHIN) 2 g in sodium chloride 0.9 % 100 mL IVPB (2 g Intravenous New Bag/Given 05/29/23 0554)  azithromycin (ZITHROMAX) 500 mg in sodium chloride 0.9 % 250 mL IVPB (has no administration in time range)  sodium chloride 0.9 % bolus 1,000 mL (1,000 mLs Intravenous New Bag/Given 05/29/23 0504)  potassium chloride SA (KLOR-CON M) CR tablet 40 mEq (40 mEq Oral Given 05/29/23 0554)     ED COURSE: Patient has a leukocytosis of 12,000 with left shift.  Potassium level slightly low at 3.1.  Will give oral replacement.  Chronic stable kidney dysfunction.  Does have a metabolic acidosis but normal anion gap, blood glucose is 154.  Lactic normal at 1.8.  Chest x-ray reviewed and interpreted by myself and the radiologist and shows right middle lobe pneumonia.  Will start her on ceftriaxone and azithromycin to cover for community-acquired pneumonia.  Will add on procalcitonin.  Still flu positive today.  Could be viral pneumonia but will cover for bacterial causes.  Has already completed a course of Tamiflu.  Will discuss with hospitalist for admission.   CONSULTS:  Consulted and discussed patient's case with hospitalist, Dr. Para March.  I  have recommended admission and consulting physician agrees and will place admission orders.  Patient (and family if present) agree with this plan.   I reviewed all nursing notes, vitals, pertinent previous records.  All labs, EKGs, imaging ordered have been independently reviewed and interpreted by myself.    OUTSIDE RECORDS REVIEWED: Reviewed urgent care note on 05/17/2023 where patient was diagnosed with influenza A and discharged with renally adjusted Tamiflu for 4  days.  Reviewed last admission in July 2024 for syncope.       FINAL CLINICAL IMPRESSION(S) / ED DIAGNOSES   Final diagnoses:  Pneumonia of right middle lobe due to infectious organism  Generalized weakness     Rx / DC Orders   ED Discharge Orders     None        Note:  This document was prepared using Dragon voice recognition software and may include unintentional dictation errors.   Leota Maka, Layla Maw, DO 05/29/23 [phone]

## 2023-05-29 NOTE — Assessment & Plan Note (Signed)
 BP stable Titrate home regimen

## 2023-05-29 NOTE — Consult Note (Signed)
 PHARMACY CONSULT NOTE - ELECTROLYTES  Pharmacy Consult for Electrolyte Monitoring and Replacement   Recent Labs: Height: 5\' 5"  (165.1 cm) Weight: 97.5 kg (215 lb) IBW/kg (Calculated) : 57 Estimated Creatinine Clearance: 35.4 mL/min (A) (by C-G formula based on SCr of 1.32 mg/dL (H)).  Potassium (mmol/L)  Date Value  05/29/2023 3.1 (L)   Magnesium (mg/dL)  Date Value  16/01/9603 2.4   Calcium (mg/dL)  Date Value  54/12/8117 8.8 (L)   Albumin (g/dL)  Date Value  14/78/2956 2.9 (L)   Phosphorus (mg/dL)  Date Value  21/30/8657 3.1   Sodium (mmol/L)  Date Value  05/29/2023 137   Assessment  Allison Rogers is a 89 y.o. female presenting with SOB after having the flu 2 weeks ago. PMH significant for hypertension, diabetes, hyperlipidemia, hypothyroidism, and chronic kidney disease. Pharmacy has been consulted to monitor and replace electrolytes.  Diet: NPO MIVF: NS @ 75 mL/hr Pertinent medications: None  Goal of Therapy: Electrolytes WNL  Plan:  K+ 3.1; KCl 40 mEq PO x 2 doses ordered by provider  Check BMP, Mg, Phos with AM labs  Thank you for allowing pharmacy to be a part of this patient's care.  Littie Deeds, PharmD Pharmacy Resident  05/29/2023 12:28 PM

## 2023-05-29 NOTE — Assessment & Plan Note (Signed)
 Blood sugar 150s  SSI  Monitor

## 2023-05-29 NOTE — Assessment & Plan Note (Signed)
 Noted decreased po intake and trouble swallowing pills today  Will formal consult SLP  NPO for now pending evaluation

## 2023-05-29 NOTE — ED Triage Notes (Signed)
 Pt to ed from home via POV for SOB. Pt had the flu 2 weeks ago and was treated with tamiflu and cough meds and hasn't gotten. Pt denies any pain and/or cough. Pt is caox4, in no acute distress  IN TRIAGE.

## 2023-05-29 NOTE — Assessment & Plan Note (Signed)
 Cont home synthroid

## 2023-05-29 NOTE — ED Notes (Signed)
 Assumed patient care at approximately 0705 and received report from the previous nurse.

## 2023-05-29 NOTE — Assessment & Plan Note (Signed)
+   R sided chest pain assd w/ cough and inspiration  Suspect secondary to RML PNA Trop and EKG WNL  Pain control Antitussives

## 2023-05-29 NOTE — Evaluation (Signed)
 Clinical/Bedside Swallow Evaluation Patient Details  Name: Allison Rogers MRN: [id number] Date of Birth: 08-30-1934  Today's Date: 05/29/2023 Time: SLP Start Time (ACUTE ONLY): 1245 SLP Stop Time (ACUTE ONLY): 1345 SLP Time Calculation (min) (ACUTE ONLY): 60 min  Past Medical History:  Past Medical History:  Diagnosis Date   Hyperlipidemia    Hypertension    Hypothyroidism    Type 2 diabetes mellitus (HCC)    Past Surgical History: History reviewed. No pertinent surgical history. HPI:  Pt is an 89 y.o. female with medical history significant of hypertension, hyperlipidemia, hypothyroidism, type 2 diabetes presenting with influenza A with pneumonia and worsening s/s of such for ~2 weeks.  History primarily from family in the setting of generalized lethargy.  Per report, patient recently diagnosed with influenza A roughly 1 week ago.  Was given course of Tamiflu.  Per the family, patient had mild improvement in symptoms still with progressively worsening symptoms over the past 3 to 4 days.  Positive malaise, decreased p.o. intake.  Worsening cough.  Positive nausea.  No reports of abdominal pain or diarrhea.  No focal hemiparesis or confusion.  Noted worsening weakness at home.   CXR: Right middle lobe pneumonia. Followup PA and lateral chest X-ray is  recommended in 3-4 weeks following trial of antibiotic therapy to  ensure resolution.   Last CXR imaging revealed low lung volumes w/ cardiomegaly in 2022 per chart.    Assessment / Plan / Recommendation  Clinical Impression   Pt seen for BSE. Pt awake, verbal and followed instructions appriately. A/O x4. Family present. Pt appeared fatigued. Pt endorsed being Sedentary at home for several days since not feeling well. Respiratory presentation appeared to sound min congested in the upper airway at rest(crackly) - NSG informed and agreed; also chatting MD re: such.  On RA, afebrile.   Pt appears to present w/ grossly functional oropharyngeal phase  swallowing w/ No overt oropharyngeal phase dysphagia noted, No neuromuscular deficits noted. Pt consumed po trials w/ No overt, clinical s/s of aspiration during the po trials. Pt appeared fatigued but exhibited appropriate follow through w/ tasks.  Pt appears at reduced risk for aspiration/aspiration pneumonia when following general aspiration precautions. She benefits from support at meals for setup, feeding d/t generalized/lengthy weakness from Flu illness at home. Pt does have challenging factors that could impact her oropharyngeal swallowing to include fatigue/weakness, lengthy illness, Missing Dentition, and advanced age. These factors can increase risk for dysphagia as well as decreased oral intake overall.   During po trials, pt consumed all consistencies w/ no overt coughing, decline in vocal quality, or change in respiratory presentation during/post trials. O2 sats remained 96-97% during. Oral phase appeared grossly [facility] w/ timely bolus management, mastication, and control of bolus propulsion for A-P transfer for swallowing. Oral clearing achieved w/ all trial consistencies -- Time, alternating foods/liquid, and moistened, soft foods given to aid oral clearing. OM Exam appeared [facility] w/ no unilateral weakness noted. Speech Clear. Pt fed self w/ setup support. Rest Breaks to aid calm breathing and for conservation of energy.   Recommend a more Mech Soft/regular consistency diet w/ well-Cut meats, moistened foods; cooked/soft foods for ease of chewing/eating. Recommend Thin liquids -- carefully monitor straw use, and pt should Hold Cup when drinking. Recommend general aspiration precautions; support w/ tray setup and feeding as needed d/t generalized weakness. Rest Breaks to aid calm breathing and for conservation of energy. Reduce distractions/talking during meals. Pills WHOLE in Puree for safer, easier swallowing -- pt  and Family educated on such for during hospitalization.   Education given on Pills  in Puree; food consistencies and easy to eat/chew options; general aspiration precautions and rest breaks to pt and Dtr. No further skilled ST services indicated at this time. NSG to reconsult if any new needs arise. NSG updated, agreed. MD updated. Recommend Dietician f/u for support. SLP Visit Diagnosis: Dysphagia, unspecified (R13.10) (in setting of Missing Dentition)    Aspiration Risk   (reduced when following general aspiration precautions)    Diet Recommendation   Thin;Dysphagia 3 (mechanical soft) (in setting of Missing Dentition, generalized weakness) = a more Mech Soft/regular consistency diet w/ well-Cut meats, moistened foods; cooked/soft foods for ease of chewing/eating. Recommend Thin liquids -- carefully monitor straw use, and pt should Hold Cup when drinking. Recommend general aspiration precautions; support w/ tray setup and feeding as needed d/t generalized weakness. Rest Breaks to aid calm breathing and for conservation of energy. Reduce distractions/talking during meals.   Medication Administration: Whole meds with puree (for ease of swallowing as needed per NSG monitoring)    Other  Recommendations Recommended Consults:  (Dietician) Oral Care Recommendations: Oral care BID;Oral care before and after PO;Patient independent with oral care (setup)    Recommendations for follow up therapy are one component of a multi-disciplinary discharge planning process, led by the attending physician.  Recommendations may be updated based on patient status, additional functional criteria and insurance authorization.  Follow up Recommendations No SLP follow up      Assistance Recommended at Discharge  Intermittent d/t weakness  Functional Status Assessment Patient has not had a recent decline in their functional status  Frequency and Duration  (n/a)   (n/a)       Prognosis Prognosis for improved oropharyngeal function: Good Barriers to Reach Goals:  (flu) Barriers/Prognosis Comment:  Missing Dentition, generalized weakness from flu      Swallow Study   General Date of Onset: 05/29/23 HPI: Pt is an 89 y.o. female with medical history significant of hypertension, hyperlipidemia, hypothyroidism, type 2 diabetes presenting with influenza A with pneumonia and worsening s/s of such for ~2 weeks.  History primarily from family in the setting of generalized lethargy.  Per report, patient recently diagnosed with influenza A roughly 1 week ago.  Was given course of Tamiflu.  Per the family, patient had mild improvement in symptoms still with progressively worsening symptoms over the past 3 to 4 days.  Positive malaise, decreased p.o. intake.  Worsening cough.  Positive nausea.  No reports of abdominal pain or diarrhea.  No focal hemiparesis or confusion.  Noted worsening weakness at home.   CXR: Right middle lobe pneumonia. Followup PA and lateral chest X-ray is  recommended in 3-4 weeks following trial of antibiotic therapy to  ensure resolution.   Last CXR imaging revealed low lung volumes w/ cardiomegaly in 2022 per chart. Type of Study: Bedside Swallow Evaluation Previous Swallow Assessment: none Diet Prior to this Study: NPO Temperature Spikes Noted: No (wbc 12.9 at admit) Respiratory Status: Room air History of Recent Intubation: No Behavior/Cognition: Alert;Cooperative;Pleasant mood Oral Cavity Assessment: Within Functional Limits Oral Care Completed by SLP: Yes Oral Cavity - Dentition: Missing dentition (several) Vision: Functional for self-feeding Self-Feeding Abilities: Able to feed self;Needs assist;Needs set up Patient Positioning: Upright in bed (MOD+ assist) Baseline Vocal Quality: Normal;Low vocal intensity (min) Volitional Cough: Strong Volitional Swallow: Able to elicit    Oral/Motor/Sensory Function Overall Oral Motor/Sensory Function: Within functional limits   Ice Chips Ice chips:  Within functional limits Presentation: Spoon (fed; 2 trials)   Thin Liquid Thin  Liquid: Within functional limits Presentation: Cup;Self Fed;Straw (~5 ozs total) Other Comments: water    Nectar Thick Nectar Thick Liquid: Not tested   Honey Thick Honey Thick Liquid: Not tested   Puree Puree: Within functional limits Presentation: Self Fed;Spoon (8+ trials)   Solid     Solid: Within functional limits (grossly when softened) Presentation: Self Fed (5 trials) Other Comments: oral clearing time        Jerilynn Som, MS, CCC-SLP Speech Language Pathologist Rehab Services; [facility] [place] [phone] (ascom) Nyajah Hyson 05/29/2023,3:48 PM

## 2023-05-29 NOTE — H&P (Signed)
 History and Physical    Patient: Allison Rogers [id number]:[id number] DOB: 1934/11/05 DOA: 05/29/2023 DOS: the patient was seen and examined on 05/29/2023 PCP: Leanna Sato, MD  Patient coming from: Home  Chief Complaint:  Chief Complaint  Patient presents with   Shortness of Breath   HPI: Allison Rogers is a 89 y.o. female with medical history significant of hypertension, hyperlipidemia, hypothyroidism, type 2 diabetes presenting with influenza A with pneumonia.  History primarily from family in the setting of generalized lethargy.  Per report, patient recently diagnosed with influenza A roughly 1 week ago.  Was given course of Tamiflu.  Per the family, patient had mild improvement in symptoms still with progressively worsening symptoms over the past 3 to 4 days.  Positive malaise, decreased p.o. intake.  Worsening cough.  Positive nausea.  No reports of abdominal pain or diarrhea.  No focal hemiparesis or confusion.  Noted worsening weakness at home.  Also with worsening confusion over the past 24 to 48 hours. Presented to the ER afebrile, hemodynamically stable.  Satting well on room air.  White count 12.9, hemoglobin 13.5, platelets 217, troponin 40s.  Pro-Cal 0.24, influenza A positive.  Creatinine 1.32.  Glucose 154.  Potassium 3.1.CXR w/ RML PNA.  Review of Systems: As mentioned in the history of present illness. All other systems reviewed and are negative. Past Medical History:  Diagnosis Date   Hyperlipidemia    Hypertension    Hypothyroidism    Type 2 diabetes mellitus (HCC)    History reviewed. No pertinent surgical history. Social History:  reports that she has never smoked. She has never used smokeless tobacco. No history on file for alcohol use and drug use.  Allergies  Allergen Reactions   Lactose Intolerance (Gi)    Penicillins Itching and Rash    Family History  Problem Relation Age of Onset   Breast cancer Neg Hx     Prior to Admission medications   Medication Sig  Start Date End Date Taking? Authorizing Provider  albuterol (VENTOLIN HFA) 108 (90 Base) MCG/ACT inhaler Inhale 1-2 puffs into the lungs every 4 (four) hours as needed for wheezing or shortness of breath. 05/17/23   Domenick Gong, MD  aspirin 81 MG EC tablet Take 81 mg by mouth daily.    [provider]  atorvastatin (LIPITOR) 80 MG tablet Take 1 tablet (80 mg total) by mouth daily. 10/16/22 10/16/23  Alford Highland, MD  benzonatate (TESSALON) 200 MG capsule Take 1 capsule (200 mg total) by mouth 3 (three) times daily as needed for cough. 05/17/23   Domenick Gong, MD  CALCIUM 600/VITAMIN D 600-400 MG-UNIT TABS Take 1 tablet by mouth 2 (two) times daily. 06/03/20   [provider]  cholecalciferol (VITAMIN D3) 25 MCG (1000 UNIT) tablet Take 1,000 Units by mouth daily.    [provider]  cyanocobalamin 1000 MCG tablet Take 1 tablet (1,000 mcg total) by mouth daily. 10/17/22   Alford Highland, MD  gabapentin (NEURONTIN) 100 MG capsule Take 100 mg by mouth 3 (three) times daily. 03/15/17   [provider]  levothyroxine (SYNTHROID) 50 MCG tablet Take 50 mcg by mouth daily. 03/13/20   [provider]  loratadine (CLARITIN) 10 MG tablet Take 10 mg by mouth daily. 08/02/22   [provider]  magnesium oxide (MAG-OX) 400 MG tablet Take 400 mg by mouth 2 (two) times daily.    [provider]  omeprazole (PRILOSEC) 20 MG capsule Take 20 mg by mouth daily. 08/31/22  [provider]  Spacer/Aero-Holding Chambers (AEROCHAMBER MV) inhaler Use as instructed 05/17/23   Domenick Gong, MD  tiZANidine (ZANAFLEX) 2 MG tablet One tab po twice a day for one week then one tab po nightly for one week then stop 10/16/22   Alford Highland, MD  VOLTAREN 1 % GEL Apply 2 g topically 2 (two) times daily as needed. 03/28/20   [provider]    Physical Exam: Vitals:   05/29/23 0929 05/29/23 1026 05/29/23 1130 05/29/23 1218  BP: (!) 136/44 (!)  139/52 (!) 168/62   Pulse: 75 87 71   Resp: (!) 23 (!) 24 (!) 32   Temp: 98.3 F (36.8 C)     TempSrc: Oral     SpO2: 96% 97% 95%   Weight:    97.5 kg  Height:       Physical Exam Constitutional:      Appearance: She is normal weight.  HENT:     Head: Normocephalic.     Nose: Nose normal.     Mouth/Throat:     Mouth: Mucous membranes are moist.  Eyes:     Pupils: Pupils are equal, round, and reactive to light.  Cardiovascular:     Rate and Rhythm: Normal rate and regular rhythm.  Pulmonary:     Effort: Pulmonary effort is normal.  Abdominal:     General: Bowel sounds are normal.  Musculoskeletal:        General: Normal range of motion.     Cervical back: Normal range of motion.  Skin:    General: Skin is warm.  Neurological:     General: No focal deficit present.  Psychiatric:        Mood and Affect: Mood normal.     Data Reviewed:  There are no new results to review at this time.  DG Chest 2 View CLINICAL DATA:  Shortness of breath.  Recent diagnosis of the flu.  EXAM: CHEST - 2 VIEW  COMPARISON:  09/01/2020  FINDINGS: Airspace disease in the right middle lobe. No edema, effusion, or pneumothorax. Normal heart size and mediastinal contours.  IMPRESSION: Right middle lobe pneumonia. Followup PA and lateral chest X-ray is recommended in 3-4 weeks following trial of antibiotic therapy to ensure resolution.  Electronically Signed   By: Tiburcio Pea M.D.   On: 05/29/2023 05:07  Lab Results  Component Value Date   WBC 12.9 (H) 05/29/2023   HGB 13.5 05/29/2023   HCT 42.6 05/29/2023   MCV 92.2 05/29/2023   PLT 217 05/29/2023   Last metabolic panel Lab Results  Component Value Date   GLUCOSE 154 (H) 05/29/2023   NA 137 05/29/2023   K 3.1 (L) 05/29/2023   CL 105 05/29/2023   CO2 17 (L) 05/29/2023   BUN 17 05/29/2023   CREATININE 1.32 (H) 05/29/2023   GFRNONAA 39 (L) 05/29/2023   CALCIUM 8.8 (L) 05/29/2023   PHOS 3.1 09/04/2020   PROT 7.0  05/29/2023   ALBUMIN 2.9 (L) 05/29/2023   BILITOT 2.0 (H) 05/29/2023   ALKPHOS 63 05/29/2023   AST 47 (H) 05/29/2023   ALT 28 05/29/2023   ANIONGAP 15 05/29/2023    Assessment and Plan: * Influenza A with pneumonia Influenza A roughly one week ago w/ progressively worsening resp status at home- now w/ superimposed RML PNA on CXR  S/p course of tamiflu  IV rocephin and azithromycin for infectious coverage  Blood and resp cultures  No hypoxia at present- monitor  Follow closely  Essential hypertension BP stable  Titrate home regimen    Type 2 diabetes mellitus (HCC) Blood sugar 150s  SSI  Monitor    Hypothyroidism Cont home synthroid   Chest pain + R sided chest pain assd w/ cough and inspiration  Suspect secondary to RML PNA Trop and EKG WNL  Pain control Antitussives  Dysphagia Noted decreased po intake and trouble swallowing pills today  Will formal consult SLP  NPO for now pending evaluation        Advance Care Planning:   Code Status: Full Code   Consults: None   Family Communication: Family at the bedside   Severity of Illness: The appropriate patient status for this patient is OBSERVATION. Observation status is judged to be reasonable and necessary in order to provide the required intensity of service to ensure the patient's safety. The patient's presenting symptoms, physical exam findings, and initial radiographic and laboratory data in the context of their medical condition is felt to place them at decreased risk for further clinical deterioration. Furthermore, it is anticipated that the patient will be medically stable for discharge from the hospital within 2 midnights of admission.   Author: Floydene Flock, MD 05/29/2023 12:22 PM  For on call review [email or web address].

## 2023-05-30 DIAGNOSIS — J09X1 Influenza due to identified novel influenza A virus with pneumonia: Secondary | ICD-10-CM | POA: Diagnosis not present

## 2023-05-30 LAB — CBG MONITORING, ED
Glucose-Capillary: 160 mg/dL — ABNORMAL HIGH (ref 70–99)
Glucose-Capillary: 152 mg/dL — ABNORMAL HIGH (ref 70–99)

## 2023-05-30 LAB — COMPREHENSIVE METABOLIC PANEL
ALT: 32 U/L (ref 0–44)
AST: 40 U/L (ref 15–41)
Albumin: 2.5 g/dL — ABNORMAL LOW (ref 3.5–5.0)
Alkaline Phosphatase: 56 U/L (ref 38–126)
Anion gap: 6 (ref 5–15)
BUN: 14 mg/dL (ref 8–23)
CO2: 21 mmol/L — ABNORMAL LOW (ref 22–32)
Calcium: 8.5 mg/dL — ABNORMAL LOW (ref 8.9–10.3)
Chloride: 110 mmol/L (ref 98–111)
Creatinine, Ser: 0.95 mg/dL (ref 0.44–1.00)
GFR, Estimated: 58 mL/min — ABNORMAL LOW (ref >60)
Glucose, Bld: 177 mg/dL — ABNORMAL HIGH (ref 70–99)
Potassium: 3.7 mmol/L (ref 3.5–5.1)
Sodium: 137 mmol/L (ref 135–145)
Total Bilirubin: 0.7 mg/dL (ref 0.0–1.2)
Total Protein: 6.6 g/dL (ref 6.5–8.1)

## 2023-05-30 LAB — CBC
HCT: 34.2 % — ABNORMAL LOW (ref 36.0–46.0)
Hemoglobin: 11 g/dL — ABNORMAL LOW (ref 12.0–15.0)
MCH: 29.5 pg (ref 26.0–34.0)
MCHC: 32.2 g/dL (ref 30.0–36.0)
MCV: 91.7 fL (ref 80.0–100.0)
Platelets: 289 10*3/uL (ref 150–400)
RBC: 3.73 MIL/uL — ABNORMAL LOW (ref 3.87–5.11)
RDW: 13.8 % (ref 11.5–15.5)
WBC: 11.4 10*3/uL — ABNORMAL HIGH (ref 4.0–10.5)
nRBC: 0 % (ref 0.0–0.2)

## 2023-05-30 LAB — GLUCOSE, CAPILLARY
Glucose-Capillary: 120 mg/dL — ABNORMAL HIGH (ref 70–99)
Glucose-Capillary: 163 mg/dL — ABNORMAL HIGH (ref 70–99)

## 2023-05-30 LAB — PHOSPHORUS: Phosphorus: 2 mg/dL — ABNORMAL LOW (ref 2.5–4.6)

## 2023-05-30 MED ORDER — HYDROCOD POLI-CHLORPHE POLI ER 10-8 MG/5ML PO SUER
5.0000 mL | Freq: Once | ORAL | Status: AC
  Administered 2023-05-30: 5 mL via ORAL
  Filled 2023-05-30: qty 5

## 2023-05-30 MED ORDER — AZITHROMYCIN 250 MG PO TABS
500.0000 mg | ORAL_TABLET | Freq: Every day | ORAL | Status: DC
  Administered 2023-05-31 – 2023-06-01 (×2): 500 mg via ORAL
  Filled 2023-05-30 (×2): qty 2

## 2023-05-30 MED ORDER — K PHOS MONO-SOD PHOS DI & MONO 155-852-130 MG PO TABS
500.0000 mg | ORAL_TABLET | Freq: Once | ORAL | Status: AC
  Administered 2023-05-30: 500 mg via ORAL
  Filled 2023-05-30: qty 2

## 2023-05-30 NOTE — Evaluation (Signed)
 Physical Therapy Evaluation Patient Details Name: Allison Rogers MRN: [id number] DOB: December 17, 1934 Today's Date: 05/30/2023  History of Present Illness  presented to ER secondary to progressive SOB; admitted for management of influenza A with pneumonia.  Clinical Impression  Patient resting in bed upon arrival to session; alert and oriented, follows commands and agreeable to participation with session. Supportive daughters (x2) in during session. Patient generally weak and deconditioned due to acute illness, but does demonstrate bilat UE/LE strength and ROM grossly [facility] for basic transfer and mobility. Indicates generalized bilat knee pain/soreness (FACES 3-4/10) due to baseline arthritic changes. Currently requiring min/mod assist for bed mobility; min assist +2 for sit/stand, standing balance and short distance gait (5' forward/backward x2 bouts) with RW.  Demonstrates forward flexed posture, heavy WBing bilat UEs; very short, shuffling steps, difficulty maintaining bilat TKE in loading phases of gait. Fatigues quickly with exertion, requiring seated rest periods between bouts of standing/stepping activities  Would benefit from skilled PT to address above deficits and promote optimal return to PLOF.; recommend post-acute PT follow up as indicated by interdisciplinary care team.            If plan is discharge home, recommend the following: A little help with walking and/or transfers;A little help with bathing/dressing/bathroom   Can travel by private vehicle   Yes    Equipment Recommendations    Recommendations for Other Services       Functional Status Assessment Patient has had a recent decline in their functional status and demonstrates the ability to make significant improvements in function in a reasonable and predictable amount of time.     Precautions / Restrictions Precautions Precautions: Fall Restrictions Weight Bearing Restrictions Per Provider Order: No      Mobility   Bed Mobility Overal bed mobility: Needs Assistance Bed Mobility: Supine to Sit, Sit to Supine     Supine to sit: Min assist, Mod assist Sit to supine: Mod assist   General bed mobility comments: assist for LE management on/off stretcher surface    Transfers Overall transfer level: Needs assistance Equipment used: Rolling walker (2 wheels) Transfers: Sit to/from Stand Sit to Stand: Min assist, +2 safety/equipment                Ambulation/Gait Ambulation/Gait assistance: Min assist, +2 safety/equipment Gait Distance (Feet):  (5' forward/backward x2) Assistive device: Rolling walker (2 wheels)         General Gait Details: forward flexed posture, heavy WBing bilat UEs; very short, shuffling steps, difficulty maintaining bilat TKE in loading phases of gait. Fatigues quickly with exertion, requiring seated rest periods between bouts of standing/stepping activities  Stairs            Wheelchair Mobility     Tilt Bed    Modified Rankin (Stroke Patients Only)       Balance Overall balance assessment: Needs assistance Sitting-balance support: No upper extremity supported, Feet supported Sitting balance-Leahy Scale: Good     Standing balance support: Bilateral upper extremity supported Standing balance-Leahy Scale: Poor                               Pertinent Vitals/Pain Pain Assessment Pain Assessment: No/denies pain    Home Living Family/patient expects to be discharged to:: Private residence Living Arrangements: Children Available Help at Discharge: Family;Available 24 hours/day Type of Home: House Home Access: Ramped entrance       Home Layout: One level Home Equipment:  Rolling Walker (2 wheels);Cane - single point;Shower seat Additional Comments: Multiple children in the area that provide check in/support as needed    Prior Function Prior Level of Function : Needs assist             Mobility Comments: Sup/mod indep for  household mobilization; intermittent use of SPC vs RW. Denies fall history.       Extremity/Trunk Assessment   Upper Extremity Assessment Upper Extremity Assessment: Generalized weakness    Lower Extremity Assessment Lower Extremity Assessment: Generalized weakness (grossly 3-/5 throughout; chronic, arthritic changes to bilat knees)       Communication        Cognition Arousal: Alert Behavior During Therapy: WFL for tasks assessed/performed   PT - Cognitive impairments: No apparent impairments                                 Cueing       General Comments      Exercises Other Exercises Other Exercises: Rolling bilat, min/mod assist; hygiene, undergarment change after incontinent bladder, max/dep assist   Assessment/Plan    PT Assessment Patient needs continued PT services  PT Problem List Decreased activity tolerance;Decreased balance;Decreased mobility;Decreased coordination;Decreased strength;Decreased knowledge of use of DME;Decreased safety awareness;Decreased knowledge of precautions;Cardiopulmonary status limiting activity;Obesity       PT Treatment Interventions DME instruction;Gait training;Functional mobility training;Therapeutic activities;Therapeutic exercise;Balance training;Cognitive remediation;Patient/family education    PT Goals (Current goals can be found in the Care Plan section)  Acute Rehab PT Goals Patient Stated Goal: to get stronger and get back home PT Goal Formulation: With patient/family Time For Goal Achievement: 06/13/23 Potential to Achieve Goals: Good    Frequency Min 1X/week     Co-evaluation               AM-PAC PT "6 Clicks" Mobility  Outcome Measure Help needed turning from your back to your side while in a flat bed without using bedrails?: A Little Help needed moving from lying on your back to sitting on the side of a flat bed without using bedrails?: A Lot Help needed moving to and from a bed to a chair  (including a wheelchair)?: A Little Help needed standing up from a chair using your arms (e.g., wheelchair or bedside chair)?: A Little Help needed to walk in hospital room?: A Little Help needed climbing 3-5 steps with a railing? : A Lot 6 Click Score: 16    End of Session Equipment Utilized During Treatment: Gait belt Activity Tolerance: Patient tolerated treatment well Patient left: in bed;with call bell/phone within reach;with family/visitor present Nurse Communication: Mobility status PT Visit Diagnosis: Muscle weakness (generalized) (M62.81);Difficulty in walking, not elsewhere classified (R26.2)    Time: 5621-3086 PT Time Calculation (min) (ACUTE ONLY): 26 min   Charges:   PT Evaluation $PT Eval Moderate Complexity: 1 Mod PT Treatments $Therapeutic Activity: 8-22 mins PT General Charges $$ ACUTE PT VISIT: 1 Visit        Allison Rogers H. Manson Passey, PT, DPT, NCS 05/30/23, 1:44 PM [phone]

## 2023-05-30 NOTE — Consult Note (Signed)
 PHARMACY CONSULT NOTE - ELECTROLYTES  Pharmacy Consult for Electrolyte Monitoring and Replacement   Recent Labs: Height: 5\' 5"  (165.1 cm) Weight: 97.5 kg (215 lb) IBW/kg (Calculated) : 57 Estimated Creatinine Clearance: 49.1 mL/min (by C-G formula based on SCr of 0.95 mg/dL).  Potassium (mmol/L)  Date Value  05/30/2023 3.7   Magnesium (mg/dL)  Date Value  78/29/5621 2.4   Calcium (mg/dL)  Date Value  30/86/5784 8.5 (L)   Albumin (g/dL)  Date Value  69/62/9528 2.5 (L)   Phosphorus (mg/dL)  Date Value  41/32/4401 2.0 (L)   Sodium (mmol/L)  Date Value  05/30/2023 137   Assessment  Allison Rogers is a 89 y.o. female presenting with SOB after having the flu 2 weeks ago. PMH significant for hypertension, diabetes, hyperlipidemia, hypothyroidism, and chronic kidney disease. Pharmacy has been consulted to monitor and replace electrolytes.  Diet: NPO MIVF: NS @ 75 mL/hr Pertinent medications: None  Goal of Therapy: Electrolytes WNL  Plan:  Phos 2.0: Give K-Phos Neutral 2 tablets x1 Check BMP, Mg, Phos with AM labs  Thank you for involving pharmacy in this patient's care.   Rockwell Alexandria, PharmD Clinical Pharmacist 05/30/2023 8:03 AM

## 2023-05-30 NOTE — Progress Notes (Signed)
 PHARMACIST - PHYSICIAN COMMUNICATION  CONCERNING: Antibiotic IV to Oral Route Change Policy  RECOMMENDATION: This patient is receiving azithromycin by the intravenous route.  Based on criteria approved by the Pharmacy and Therapeutics Committee, the antibiotic(s) is/are being converted to the equivalent oral dose form(s).  DESCRIPTION: These criteria include: Patient being treated for a respiratory tract infection, urinary tract infection, cellulitis or clostridium difficile associated diarrhea if on metronidazole The patient is not neutropenic and does not exhibit a GI malabsorption state The patient is eating (either orally or via tube) and/or has been taking other orally administered medications for a least 24 hours The patient is improving clinically and has a Tmax < 100.5  If you have questions about this conversion, please contact the Pharmacy Department   Tressie Ellis 05/30/23

## 2023-05-30 NOTE — Progress Notes (Signed)
 Progress Note   Patient: Allison Rogers UJW:[id number] DOB: [birth year] DOA: 05/29/2023     1 DOS: the patient was seen and examined on 05/30/2023   Brief hospital course:  Allison Rogers is a 89 y.o. female with medical history significant of hypertension, hyperlipidemia, hypothyroidism, type 2 diabetes presenting with influenza A with pneumonia.  History primarily from family in the setting of generalized lethargy.  Per report, patient recently diagnosed with influenza A roughly 1 week ago.  Was given course of Tamiflu.  Per the family, patient had mild improvement in symptoms still with progressively worsening symptoms over the past 3 to 4 days.  Positive malaise, decreased p.o. intake.  Worsening cough.  Positive nausea.  No reports of abdominal pain or diarrhea.  No focal hemiparesis or confusion.  Noted worsening weakness at home.  Also with worsening confusion over the past 24 to 48 hours. Presented to the ER afebrile, hemodynamically stable.  Satting well on room air.  White count 12.9, hemoglobin 13.5, platelets 217, troponin 40s.  Pro-Cal 0.24, influenza A positive.  Creatinine 1.32.  Glucose 154.  Potassium 3.1.CXR w/ RML PNA.     Assessment and Plan:  * Influenza A with pneumonia Influenza A roughly one week ago w/ progressively worsening resp status at home- now w/ superimposed RML PNA on CXR  S/p course of tamiflu  Continue IV rocephin and azithromycin to complete a 5-day course of therapy for superimposed bacterial infection  Blood and resp cultures  No hypoxia at present- monitor  Follow closely    Essential hypertension BP stable       Type 2 diabetes mellitus (HCC) Continue sliding scale coverage Check blood sugars before meals and at bedtime     Hypothyroidism Cont home synthroid    Chest pain, pleuritic + R sided chest pain assd w/ cough and inspiration  Suspect secondary to RML PNA Trop and EKG WNL  Pain control Antitussives   Dysphagia Noted decreased  po intake and trouble swallowing pills today  Appreciate speech therapy input No evidence of aspiration noted Continue dysphagia 3 diet        Subjective: Patient is seen and examined at the bedside.  Feels better  Physical Exam: Vitals:   05/30/23 1000 05/30/23 1145 05/30/23 1200 05/30/23 1358  BP: 139/63  (!) 153/75   Pulse: 72  72   Resp: 19  12   Temp:  98.3 F (36.8 C)  98.3 F (36.8 C)  TempSrc:  Oral  Oral  SpO2: 96%  92%   Weight:      Height:       Constitutional:      Appearance: She is normal weight.  HENT:     Head: Normocephalic.     Nose: Nose normal.     Mouth/Throat:     Mouth: Mucous membranes are moist.  Eyes:     Pupils: Pupils are equal, round, and reactive to light.  Cardiovascular:     Rate and Rhythm: Normal rate and regular rhythm.  Pulmonary:     Effort: Pulmonary effort is normal.    Rhonchi right middle lobe Abdominal:     General: Bowel sounds are normal.  Musculoskeletal:        General: Normal range of motion.     Cervical back: Normal range of motion.  Skin:    General: Skin is warm.  Neurological:     General: No focal deficit present.  Psychiatric:        Mood and Affect:  Mood normal.    Data Reviewed: Labs reviewed. There are no new results to review at this time.  Family Communication: Plan of care discussed with patient in detail.  She verbalizes understanding and agrees with the plan  Disposition: Status is: Inpatient Remains inpatient appropriate because: On antibiotics for pneumonia  Planned Discharge Destination:  TBD    Time spent: 37 minutes  Author: Lucile Shutters, MD 05/30/2023 2:56 PM  For on call review [email or web address].

## 2023-05-30 NOTE — ED Notes (Signed)
Pt's brief changed and pt pulled up in bed.

## 2023-05-30 NOTE — Evaluation (Signed)
 Occupational Therapy Evaluation Patient Details Name: Allison Rogers MRN: [id number] DOB: [birth year] Today's Date: 05/30/2023   History of Present Illness   presented to ER secondary to progressive SOB; admitted for management of influenza A with pneumonia.     Clinical Impressions Pt was seen for OT evaluation this date. Prior to hospital admission, pt was living alone and Mod I/IND with ADLs. Multiple family members live nearby that check in and assist with IADLs. Pt reports IND with ADLs and mobility using SPC or RW.   Pt presents to acute OT demonstrating impaired ADL performance and functional mobility 2/2 weakness and low activity tolerance (See OT problem list for additional functional deficits). Pt currently requires CGA for supine to sit with increased effort from high gurney and Mod A for BLE management to return to supine. Able to sit at EOB with SUP for balance. STS from high gurney with Mod A to RW and able to take lateral steps to [facility] with CGA and increased time. Pt stood for ~5 mins while brief was removed and new one placed, as well as peri-care provided. Pt able to perform anterior hygiene with CGA in standing with unilateral support on RW. UB dressing/bathing with Min/Mod A while seated EOB d/t fatigue. VSS throughout session, but pt endorsing fatigue. Pt would benefit from skilled OT services to address noted impairments and functional limitations (see below for any additional details) in order to maximize safety and independence while minimizing falls risk and caregiver burden. Do anticipate the need for follow up OT services upon acute hospital DC.      If plan is discharge home, recommend the following:   A lot of help with bathing/dressing/bathroom;A little help with walking and/or transfers;Help with stairs or ramp for entrance;Assist for transportation     Functional Status Assessment   Patient has had a recent decline in their functional status and demonstrates  the ability to make significant improvements in function in a reasonable and predictable amount of time.     Equipment Recommendations   BSC/3in1;Tub/shower seat     Recommendations for Other Services         Precautions/Restrictions   Precautions Precautions: Fall Restrictions Weight Bearing Restrictions Per Provider Order: No     Mobility Bed Mobility Overal bed mobility: Needs Assistance Bed Mobility: Supine to Sit, Sit to Supine     Supine to sit: Contact guard Sit to supine: Mod assist   General bed mobility comments: CGA with increased time and effort to reach EOB from high gurney; Mod A for BLE management to return to bed and Max A x2 to scoot to [facility]    Transfers Overall transfer level: Needs assistance Equipment used: Rolling walker (2 wheels) Transfers: Sit to/from Stand Sit to Stand: Mod assist, From elevated surface           General transfer comment: Mod A for STS from EOB to RW then pt able to take lateral steps to [facility] with increased time/effort and CGA/MIN A; extended standing time for peri-care and brief change      Balance Overall balance assessment: Needs assistance Sitting-balance support: No upper extremity supported, Feet supported Sitting balance-Leahy Scale: Good Sitting balance - Comments: steady seated EOB   Standing balance support: Bilateral upper extremity supported, Reliant on assistive device for balance Standing balance-Leahy Scale: Poor Standing balance comment: Min/CGA to stand at [facility] for lateral steps; unilateral support and CGA during anterior hygiene  ADL either performed or assessed with clinical judgement   ADL Overall ADL's : Needs assistance/impaired                 Upper Body Dressing : Minimal assistance;Sitting   Lower Body Dressing: Total assistance;Maximal assistance;Sit to/from stand Lower Body Dressing Details (indicate cue type and reason): to change briefs  standing at bedside with assist from pt's daughter     Toileting- Architect and Hygiene: Sit to/from stand;Moderate assistance Toileting - Clothing Manipulation Details (indicate cue type and reason): standing at bedside-pt able to stand with CGA to perform anterior hygiene; assist for buttocks             Vision         Perception         Praxis         Pertinent Vitals/Pain Pain Assessment Pain Assessment: Faces Faces Pain Scale: Hurts a little bit Pain Location: BLEs with initial movement OOB Pain Intervention(s): Monitored during session, Repositioned     Extremity/Trunk Assessment Upper Extremity Assessment Upper Extremity Assessment: Generalized weakness   Lower Extremity Assessment Lower Extremity Assessment: Generalized weakness       Communication Communication Communication: No apparent difficulties   Cognition Arousal: Alert Behavior During Therapy: WFL for tasks assessed/performed                                 Following commands: Intact       Cueing  General Comments   Cueing Techniques: Verbal cues      Exercises Other Exercises Other Exercises: Edu on role of OT in acute setting   Shoulder Instructions      Home Living Family/patient expects to be discharged to:: Private residence Living Arrangements: Children Available Help at Discharge: Family;Available 24 hours/day Type of Home: House Home Access: Ramped entrance     Home Layout: One level               Home Equipment: Agricultural consultant (2 wheels);Cane - single point;Shower seat   Additional Comments: Multiple children in the area that provide check in/support as needed      Prior Functioning/Environment Prior Level of Function : Needs assist             Mobility Comments: Sup/mod indep for household mobilization; intermittent use of SPC vs RW. Denies fall history. ADLs Comments: reports IND; family assist for IADLs    OT Problem  List: Decreased strength;Decreased activity tolerance;Impaired balance (sitting and/or standing)   OT Treatment/Interventions: Self-care/ADL training;Therapeutic exercise;Balance training;Therapeutic activities;Energy conservation;DME and/or AE instruction;Patient/family education      OT Goals(Current goals can be found in the care plan section)   Acute Rehab OT Goals Patient Stated Goal: improve endurance OT Goal Formulation: With patient/family Time For Goal Achievement: 06/13/23 Potential to Achieve Goals: Fair ADL Goals Pt Will Perform Lower Body Bathing: sit to/from stand;sitting/lateral leans;with supervision Pt Will Perform Lower Body Dressing: with supervision;sitting/lateral leans;sit to/from stand Pt Will Transfer to Toilet: ambulating;bedside commode;regular height toilet;grab bars;with contact guard assist;with supervision Pt Will Perform Toileting - Clothing Manipulation and hygiene: with supervision;sitting/lateral leans;sit to/from stand Additional ADL Goal #1: Pt will demo use of 1 learned ECS to use during ADL performance to prevent overexertion 3/3 trials.   OT Frequency:  Min 1X/week    Co-evaluation              AM-PAC OT "6 Clicks" Daily Activity  Outcome Measure Help from another person eating meals?: None Help from another person taking care of personal grooming?: A Little Help from another person toileting, which includes using toliet, bedpan, or urinal?: A Lot Help from another person bathing (including washing, rinsing, drying)?: A Lot Help from another person to put on and taking off regular upper body clothing?: A Little Help from another person to put on and taking off regular lower body clothing?: A Lot 6 Click Score: 16   End of Session Equipment Utilized During Treatment: Rolling walker (2 wheels);Oxygen;Gait belt Nurse Communication: Mobility status  Activity Tolerance: Patient tolerated treatment well Patient left: in bed;with call  bell/phone within reach;with family/visitor present  OT Visit Diagnosis: Other abnormalities of gait and mobility (R26.89);Muscle weakness (generalized) (M62.81)                Time: 1610-9604 OT Time Calculation (min): 18 min Charges:  OT General Charges $OT Visit: 1 Visit OT Evaluation $OT Eval Moderate Complexity: 1 Mod Kayliee Atienza, OTR/L 05/30/23, 3:12 PM  Cassaundra Rasch E Sirenity Shew 05/30/2023, 3:08 PM

## 2023-05-31 DIAGNOSIS — J09X1 Influenza due to identified novel influenza A virus with pneumonia: Secondary | ICD-10-CM | POA: Diagnosis not present

## 2023-05-31 LAB — MAGNESIUM: Magnesium: 2.1 mg/dL (ref 1.7–2.4)

## 2023-05-31 LAB — BASIC METABOLIC PANEL
Anion gap: 7 (ref 5–15)
BUN: 13 mg/dL (ref 8–23)
CO2: 22 mmol/L (ref 22–32)
Calcium: 8.6 mg/dL — ABNORMAL LOW (ref 8.9–10.3)
Chloride: 110 mmol/L (ref 98–111)
Creatinine, Ser: 0.93 mg/dL (ref 0.44–1.00)
GFR, Estimated: 60 mL/min — ABNORMAL LOW (ref >60)
Glucose, Bld: 130 mg/dL — ABNORMAL HIGH (ref 70–99)
Potassium: 3.4 mmol/L — ABNORMAL LOW (ref 3.5–5.1)
Sodium: 139 mmol/L (ref 135–145)

## 2023-05-31 LAB — GLUCOSE, CAPILLARY
Glucose-Capillary: 135 mg/dL — ABNORMAL HIGH (ref 70–99)
Glucose-Capillary: 133 mg/dL — ABNORMAL HIGH (ref 70–99)
Glucose-Capillary: 119 mg/dL — ABNORMAL HIGH (ref 70–99)
Glucose-Capillary: 153 mg/dL — ABNORMAL HIGH (ref 70–99)

## 2023-05-31 LAB — PHOSPHORUS: Phosphorus: 3 mg/dL (ref 2.5–4.6)

## 2023-05-31 MED ORDER — POTASSIUM CHLORIDE 20 MEQ PO PACK
20.0000 meq | PACK | Freq: Once | ORAL | Status: AC
  Administered 2023-05-31: 20 meq via ORAL
  Filled 2023-05-31: qty 1

## 2023-05-31 NOTE — TOC Progression Note (Signed)
 Transition of Care [facility]) - Progression Note    Patient Details  Name: Allison Rogers MRN: [id number] Date of Birth: 1934-04-24  Transition of Care [facility]) CM/SW Contact  Margarito Liner, LCSW Phone Number: 05/31/2023, 4:29 PM  Clinical Narrative:   [facility] has accepted referral for PT and OT.  Expected Discharge Plan: Home w Home Health Services Barriers to Discharge: Continued Medical Work up  Expected Discharge Plan and Services                                   HH Arranged: PT, OT [facility] Agency: [facility] Date [facility] Agency Contacted: 05/31/23   Representative spoke with at [facility] Agency: Kandee Keen   Social Determinants of Health (SDOH) Interventions SDOH Screenings   Food Insecurity: No Food Insecurity (05/30/2023)  Housing: Unknown (05/30/2023)  Transportation Needs: No Transportation Needs (05/30/2023)  Utilities: Not At Risk (05/30/2023)  Social Connections: Moderately Integrated (05/30/2023)  Tobacco Use: Low Risk  (05/29/2023)    Readmission Risk Interventions     No data to display

## 2023-05-31 NOTE — Plan of Care (Signed)

## 2023-05-31 NOTE — Consult Note (Signed)
 PHARMACY CONSULT NOTE - ELECTROLYTES  Pharmacy Consult for Electrolyte Monitoring and Replacement   Recent Labs: Height: 5\' 5"  (165.1 cm) Weight: 97.5 kg (215 lb) IBW/kg (Calculated) : 57 Estimated Creatinine Clearance: 50.2 mL/min (by C-G formula based on SCr of 0.93 mg/dL).  Potassium (mmol/L)  Date Value  05/31/2023 3.4 (L)   Magnesium (mg/dL)  Date Value  16/01/9603 2.1   Calcium (mg/dL)  Date Value  54/12/8117 8.6 (L)   Albumin (g/dL)  Date Value  14/78/2956 2.5 (L)   Phosphorus (mg/dL)  Date Value  21/30/8657 3.0   Sodium (mmol/L)  Date Value  05/31/2023 139   Assessment  Allison Rogers is a 89 y.o. female presenting with SOB after having the flu 2 weeks ago. PMH significant for hypertension, diabetes, hyperlipidemia, hypothyroidism, and chronic kidney disease. Pharmacy has been consulted to monitor and replace electrolytes.  Diet: Dysphagia 3 MIVF: N/A Pertinent medications: None  Goal of Therapy: Electrolytes WNL  Plan:  K 3.4: Give KCL 20 mEq PO x1 Check BMP, Mg, Phos with AM labs  Thank you for involving pharmacy in this patient's care.   Rockwell Alexandria, PharmD Clinical Pharmacist 05/31/2023 7:21 AM

## 2023-05-31 NOTE — TOC Initial Note (Addendum)
 Transition of Care [facility]) - Initial/Assessment Note    Patient Details  Name: Allison Rogers MRN: [id number] Date of Birth: 1935-02-28  Transition of Care [facility]) CM/SW Contact:    Chapman Fitch, RN Phone Number: 05/31/2023, 12:21 PM  Clinical Narrative:                 Met with patient, daughter, son and daughter in law at bedside  Admitted for: Flu PNA Admitted from: home with family PCP: [facility] Current home health/prior home health/DME: [place], [place]  Therapy recommending SNF.  Patient and family declines.  Patient states that she is in agreement for home health and does not have a preference of agency.  Referral made to [facility] with [facility].  Family denies the need for any additional DME at discharge  Family states they will provide transport at discharge  Update.  [facility] unable to accept.  [facility] to review  Expected Discharge Plan: Home w Home Health Services Barriers to Discharge: Continued Medical Work up   Patient Goals and CMS Choice   CMS Medicare.gov Compare Post Acute Care list provided to:: Patient        Expected Discharge Plan and Services                                   HH Arranged: PT, OT [facility] Agency: [facility] Date [facility] Agency Contacted: 05/31/23   Representative spoke with at [facility] Agency: Kandee Keen  Prior Living Arrangements/Services   Lives with:: Adult Children Patient language and need for interpreter reviewed:: Yes Do you feel safe going back to the place where you live?: Yes      Need for Family Participation in Patient Care: Yes (Comment) Care giver support system in place?: Yes (comment) Current home services: DME Criminal Activity/Legal Involvement Pertinent to Current Situation/Hospitalization: No - Comment as needed  Activities of Daily Living   ADL Screening (condition at time of admission) Independently performs ADLs?: No Does the patient have a NEW difficulty with bathing/dressing/toileting/self-feeding that is  expected to last >3 days?: No Does the patient have a NEW difficulty with getting in/out of bed, walking, or climbing stairs that is expected to last >3 days?: No Does the patient have a NEW difficulty with communication that is expected to last >3 days?: No Is the patient deaf or have difficulty hearing?: No Does the patient have difficulty seeing, even when wearing glasses/contacts?: No Does the patient have difficulty concentrating, remembering, or making decisions?: No  Permission Sought/Granted                  Emotional Assessment              Admission diagnosis:  Influenza A with pneumonia [J09.X1] Generalized weakness [R53.1] Pneumonia of right middle lobe due to infectious organism [J18.9] Patient Active Problem List   Diagnosis Date Noted   Influenza A with pneumonia 05/29/2023   Dysphagia 05/29/2023   Chest pain 05/29/2023   Acute stroke due to ischemia (HCC) 10/16/2022   Myocardial injury 10/16/2022   Bursitis of hip 10/15/2022   Syncope 10/15/2022   Essential hypertension 10/15/2022   Chronic kidney disease, stage III (moderate) (HCC) 10/15/2022   Displacement of lumbar intervertebral disc without myelopathy 11/05/2021   Bradycardia 09/02/2020   Hypothyroidism 09/02/2020   Type 2 diabetes mellitus (HCC) 09/02/2020   Hypovolemic shock (HCC) 09/02/2020   Primary open angle glaucoma (POAG) of both  eyes, severe stage 11/14/2018   Pseudophakia of both eyes 11/14/2018   PCP:  Leanna Sato, MD Pharmacy:   [facility] - [place], [place] - [address] [place] [address] Phone: [phone] Fax: [phone]  [facility] [address]), [place] - [address] [place]) [place] [address] Phone: [phone] Fax: [phone]     Social Drivers of Health (SDOH) Social History: SDOH Screenings   Food Insecurity: No Food Insecurity (05/30/2023)  Housing: Unknown  (05/30/2023)  Transportation Needs: No Transportation Needs (05/30/2023)  Utilities: Not At Risk (05/30/2023)  Social Connections: Moderately Integrated (05/30/2023)  Tobacco Use: Low Risk  (05/29/2023)   SDOH Interventions:     Readmission Risk Interventions     No data to display

## 2023-05-31 NOTE — Progress Notes (Signed)
   05/31/23 1345  Spiritual Encounters  Type of Visit Initial  Care provided to: Family  Referral source Chaplain assessment  Reason for visit Routine spiritual support  OnCall Visit No  Interventions  Spiritual Care Interventions Made Established relationship of care and support;Compassionate presence  Intervention Outcomes  Outcomes Connection to spiritual care;Awareness around self/spiritual resourses  Spiritual Care Plan  Spiritual Care Issues Still Outstanding No further spiritual care needs at this time (see row info)   Helped daughter get to room from lobby

## 2023-05-31 NOTE — Progress Notes (Signed)
 Occupational Therapy Treatment Patient Details Name: Allison Rogers MRN: [id number] DOB: [birth year] Today's Date: 05/31/2023   History of present illness presented to ER secondary to progressive SOB; admitted for management of influenza A with pneumonia.   OT comments  Pt is supine in bed on arrival. Pleasant and agreeable to OT session. She denies pain. Pt performed bed mobility with SUP, HOB elevated, use of bed rails and increased time although no physical assist needed. She required Mod A for LB dressing to don socks seated EOB. CGA for STS from EOB at lowest height to RW with increased time/effort and cueing for hand placement then ambulated to the door to perform oral care standing at sink with CGA/SBA and BUEs supported on sink. Ambulated back to recliner with CGA and left seated with all needs in place and will cont to require skilled acute OT services to maximize her safety and IND to return to PLOF. Sp02 95-97% on RA throughout session.       If plan is discharge home, recommend the following:  A lot of help with bathing/dressing/bathroom;A little help with walking and/or transfers;Help with stairs or ramp for entrance;Assist for transportation   Equipment Recommendations  BSC/3in1;Tub/shower seat    Recommendations for Other Services      Precautions / Restrictions Precautions Precautions: Fall Restrictions Weight Bearing Restrictions Per Provider Order: No       Mobility Bed Mobility Overal bed mobility: Needs Assistance Bed Mobility: Supine to Sit     Supine to sit: HOB elevated, Supervision, Used rails     General bed mobility comments: SUP, increased time and effort to get to EOB    Transfers Overall transfer level: Needs assistance Equipment used: Rolling walker (2 wheels) Transfers: Sit to/from Stand Sit to Stand: Contact guard assist           General transfer comment: CGA with increased time and cueing for hand placement to push up from EOB to RW  but able to do with no physical assist; CGA for in room mobility to the door and back to recliner     Balance Overall balance assessment: Needs assistance Sitting-balance support: No upper extremity supported, Feet supported Sitting balance-Leahy Scale: Good Sitting balance - Comments: steady seated EOB   Standing balance support: Bilateral upper extremity supported, Reliant on assistive device for balance Standing balance-Leahy Scale: Fair Standing balance comment: CGA for RW mobility in room with no LOB, but pt admitting to weakness/slower pace                           ADL either performed or assessed with clinical judgement   ADL Overall ADL's : Needs assistance/impaired     Grooming: Oral care;Standing;Contact guard assist;Supervision/safety Grooming Details (indicate cue type and reason): standing at sink with BUE supported on sink and CGA/SBA             Lower Body Dressing: Moderate assistance Lower Body Dressing Details (indicate cue type and reason): Mod A to don socks seated at EOB             Functional mobility during ADLs: Contact guard assist;Rolling walker (2 wheels)      Extremity/Trunk Assessment              Vision       Perception     Praxis     Communication Communication Communication: No apparent difficulties   Cognition Arousal: Alert Behavior During Therapy: [facility] for tasks  assessed/performed                                 Following commands: Intact        Cueing   Cueing Techniques: Verbal cues  Exercises      Shoulder Instructions       General Comments sp02 95-97% throughout session on RA    Pertinent Vitals/ Pain       Pain Assessment Pain Assessment: No/denies pain Pain Intervention(s): Monitored during session  Home Living                                          Prior Functioning/Environment              Frequency  Min 1X/week        Progress Toward  Goals  OT Goals(current goals can now be found in the care plan section)  Progress towards OT goals: Progressing toward goals  Acute Rehab OT Goals Patient Stated Goal: increase strength OT Goal Formulation: With patient Time For Goal Achievement: 06/13/23 Potential to Achieve Goals: Fair  Plan      Co-evaluation                 AM-PAC OT "6 Clicks" Daily Activity     Outcome Measure   Help from another person eating meals?: None Help from another person taking care of personal grooming?: None Help from another person toileting, which includes using toliet, bedpan, or urinal?: A Little Help from another person bathing (including washing, rinsing, drying)?: A Lot Help from another person to put on and taking off regular upper body clothing?: A Little Help from another person to put on and taking off regular lower body clothing?: A Lot 6 Click Score: 18    End of Session Equipment Utilized During Treatment: Rolling walker (2 wheels);Oxygen;Gait belt  OT Visit Diagnosis: Other abnormalities of gait and mobility (R26.89);Muscle weakness (generalized) (M62.81)   Activity Tolerance Patient tolerated treatment well   Patient Left in chair;with call bell/phone within reach;with chair alarm set   Nurse Communication Mobility status        Time: 1324-4010 OT Time Calculation (min): 20 min  Charges: OT General Charges $OT Visit: 1 Visit OT Treatments $Self Care/Home Management : 8-22 mins  Vivien Rossetti, OTR/L  05/31/23, 12:33 PM   Dmiya Malphrus E Emelyn Roen 05/31/2023, 12:31 PM

## 2023-05-31 NOTE — Progress Notes (Signed)
 Progress Note   Patient: Allison Rogers ZOX:[id number] DOB: 1934-05-20 DOA: 05/29/2023     2 DOS: the patient was seen and examined on 05/31/2023   Brief hospital course:  Allison Rogers is a 89 y.o. female with medical history significant of hypertension, hyperlipidemia, hypothyroidism, type 2 diabetes presenting with influenza A with pneumonia.  History primarily from family in the setting of generalized lethargy.  Per report, patient recently diagnosed with influenza A roughly 1 week ago.  Was given course of Tamiflu.  Per the family, patient had mild improvement in symptoms still with progressively worsening symptoms over the past 3 to 4 days.  Positive malaise, decreased p.o. intake.  Worsening cough.  Positive nausea.  No reports of abdominal pain or diarrhea.  No focal hemiparesis or confusion.  Noted worsening weakness at home.  Also with worsening confusion over the past 24 to 48 hours. Presented to the ER afebrile, hemodynamically stable.  Satting well on room air.  White count 12.9, hemoglobin 13.5, platelets 217, troponin 40s.  Pro-Cal 0.24, influenza A positive.  Creatinine 1.32.  Glucose 154.  Potassium 3.1.CXR w/ RML PNA.       Assessment and Plan:  * Influenza A with pneumonia Influenza A roughly one week ago w/ progressively worsening resp status at home- now w/ superimposed RML PNA on CXR  S/p course of tamiflu  Continue IV rocephin and azithromycin patient may be discharged home on oral antibiotics Appreciate speech therapy input no evidence of aspiration Blood cultures are sterile No hypoxia at present- monitor       Essential hypertension Start patient on amlodipine 5 mg daily       Type 2 diabetes mellitus (HCC) Continue sliding scale coverage Check blood sugars before meals and at bedtime     Hypothyroidism Cont home synthroid    Chest pain, pleuritic + R sided chest pain assd w/ cough and inspiration  Suspect secondary to RML PNA Trop and EKG WNL  Pain  control Antitussives    Dysphagia Noted decreased po intake and trouble swallowing pills today  Appreciate speech therapy input No evidence of aspiration noted Continue dysphagia 3 diet           Subjective: No new complaints  Physical Exam: Vitals:   05/30/23 1653 05/30/23 2019 05/31/23 0335 05/31/23 0751  BP: (!) 162/83 (!) 187/66 (!) 182/66 (!) 156/73  Pulse: 78 79 83 68  Resp: 18 18 18 16   Temp: 98.3 F (36.8 C) 98.8 F (37.1 C) 98.6 F (37 C) 97.9 F (36.6 C)  TempSrc: Oral Oral Oral Oral  SpO2: 94% 97% 98% 92%  Weight:      Height:       Constitutional:      Appearance: She is normal weight.  HENT:     Head: Normocephalic.     Nose: Nose normal.     Mouth/Throat:     Mouth: Mucous membranes are moist.  Eyes:     Pupils: Pupils are equal, round, and reactive to light.  Cardiovascular:     Rate and Rhythm: Normal rate and regular rhythm.  Pulmonary:     Effort: Pulmonary effort is normal.    Rhonchi right middle lobe Abdominal:     General: Bowel sounds are normal.  Musculoskeletal:        General: Normal range of motion.     Cervical back: Normal range of motion.  Skin:    General: Skin is warm.  Neurological:     General: No  focal deficit present.  Psychiatric:        Mood and Affect: Mood normal.     Data Reviewed: Labs reviewed.  Potassium 3.4 There are no new results to review at this time.  Family Communication: Plan of care discussed with patient in detail.  She verbalizes understanding and agrees to the plan  Disposition: Status is: Inpatient Remains inpatient appropriate because: Discharge planning.  Awaiting PT evaluation  Planned Discharge Destination:  TBD    Time spent: 33 minutes  Author: Lucile Shutters, MD 05/31/2023 2:59 PM  For on call review [email or web address].

## 2023-06-01 ENCOUNTER — Other Ambulatory Visit: Payer: Self-pay

## 2023-06-01 DIAGNOSIS — J09X1 Influenza due to identified novel influenza A virus with pneumonia: Secondary | ICD-10-CM | POA: Diagnosis not present

## 2023-06-01 LAB — GLUCOSE, CAPILLARY
Glucose-Capillary: 178 mg/dL — ABNORMAL HIGH (ref 70–99)
Glucose-Capillary: 138 mg/dL — ABNORMAL HIGH (ref 70–99)

## 2023-06-01 LAB — BASIC METABOLIC PANEL
Anion gap: 7 (ref 5–15)
BUN: 11 mg/dL (ref 8–23)
CO2: 23 mmol/L (ref 22–32)
Calcium: 8.6 mg/dL — ABNORMAL LOW (ref 8.9–10.3)
Chloride: 109 mmol/L (ref 98–111)
Creatinine, Ser: 0.88 mg/dL (ref 0.44–1.00)
GFR, Estimated: >60 mL/min (ref >60)
Glucose, Bld: 127 mg/dL — ABNORMAL HIGH (ref 70–99)
Potassium: 3.6 mmol/L (ref 3.5–5.1)
Sodium: 139 mmol/L (ref 135–145)

## 2023-06-01 LAB — MAGNESIUM: Magnesium: 1.8 mg/dL (ref 1.7–2.4)

## 2023-06-01 MED ORDER — MAGNESIUM SULFATE 2 GM/50ML IV SOLN
2.0000 g | Freq: Once | INTRAVENOUS | Status: AC
  Administered 2023-06-01: 2 g via INTRAVENOUS
  Filled 2023-06-01: qty 50

## 2023-06-01 MED ORDER — AZITHROMYCIN 250 MG PO TABS
250.0000 mg | ORAL_TABLET | Freq: Every day | ORAL | 0 refills | Status: AC
  Filled 2023-06-01: qty 2, 2d supply, fill #0

## 2023-06-01 MED ORDER — AZITHROMYCIN 250 MG PO TABS: 250.0000 mg | ORAL_TABLET | Freq: Every day | ORAL | 0 refills | Status: DC

## 2023-06-01 NOTE — Plan of Care (Signed)
  Problem: Education: Goal: Ability to describe self-care measures that may prevent or decrease complications (Diabetes Survival Skills Education) will improve Outcome: Progressing   Problem: Health Behavior/Discharge Planning: Goal: Ability to identify and utilize available resources and services will improve Outcome: Progressing   Problem: Skin Integrity: Goal: Risk for impaired skin integrity will decrease Outcome: Progressing   Problem: Pain Managment: Goal: General experience of comfort will improve and/or be controlled Outcome: Progressing

## 2023-06-01 NOTE — TOC Transition Note (Signed)
 Transition of Care [facility]) - Discharge Note   Patient Details  Name: Allison Rogers MRN: [id number] Date of Birth: [birth year]  [facility]) CM/SW Contact:  Chapman Fitch, RN Phone Number: 06/01/2023, 2:12 PM   Clinical Narrative:     Patient to dc today [place] with [facility] notified     Barriers to Discharge: Continued Medical Work up   Patient Goals and CMS Choice   CMS Medicare.gov Compare Post Acute Care list provided to:: Patient        Discharge Placement                       Discharge Plan and Services Additional resources added to the After Visit Summary for                            [facility] Arranged: PT, OT [facility] Agency: [facility] Care Date [facility] Agency Contacted: 05/31/23   Representative spoke with at [facility] Agency: Kandee Keen  Social Drivers of Health (SDOH) Interventions SDOH Screenings   Food Insecurity: No Food Insecurity (05/30/2023)  Housing: Unknown (05/30/2023)  Transportation Needs: No Transportation Needs (05/30/2023)  Utilities: Not At Risk (05/30/2023)  Social Connections: Moderately Integrated (05/30/2023)  Tobacco Use: Low Risk  (05/29/2023)     Readmission Risk Interventions     No data to display

## 2023-06-01 NOTE — Progress Notes (Signed)
 Physical Therapy Treatment Patient Details Name: Allison Rogers MRN: [id number] DOB: 03/23/1935 Today's Date: 06/01/2023   History of Present Illness presented to ER secondary to progressive SOB; admitted for management of influenza A with pneumonia.    PT Comments  Pt is making improved progress with ability to ambulate in room with decreased physical assistance. RW used with safe technique. Does fatigue with increased distance. Is hopeful for dc this date. Will update dispo.    If plan is discharge home, recommend the following: A little help with walking and/or transfers;A little help with bathing/dressing/bathroom   Can travel by private vehicle     Yes  Equipment Recommendations  None recommended by PT    Recommendations for Other Services       Precautions / Restrictions Precautions Precautions: Fall Recall of Precautions/Restrictions: Intact Restrictions Weight Bearing Restrictions Per Provider Order: No     Mobility  Bed Mobility Overal bed mobility: Needs Assistance Bed Mobility: Supine to Sit     Supine to sit: Supervision Sit to supine: Contact guard assist   General bed mobility comments: safe technique. Needs slight assist to move B LE back into bed    Transfers Overall transfer level: Needs assistance Equipment used: Rolling walker (2 wheels) Transfers: Sit to/from Stand Sit to Stand: Contact guard assist           General transfer comment: safe technique with upright posture with RW    Ambulation/Gait Ambulation/Gait assistance: Contact guard assist Gait Distance (Feet): 40 Feet Assistive device: Rolling walker (2 wheels) Gait Pattern/deviations: Step-through pattern       General Gait Details: ambulated in room with reciprocal gait pattern and slow speed. Fatigues with increased distance   Stairs             Wheelchair Mobility     Tilt Bed    Modified Rankin (Stroke Patients Only)       Balance Overall balance  assessment: Needs assistance Sitting-balance support: No upper extremity supported, Feet supported Sitting balance-Leahy Scale: Good     Standing balance support: Bilateral upper extremity supported, Reliant on assistive device for balance Standing balance-Leahy Scale: Fair                              Hotel manager: No apparent difficulties  Cognition Arousal: Alert Behavior During Therapy: WFL for tasks assessed/performed   PT - Cognitive impairments: No apparent impairments                       PT - Cognition Comments: pleasant and agreeable to session Following commands: Intact      Cueing Cueing Techniques: Verbal cues  Exercises      General Comments        Pertinent Vitals/Pain Pain Assessment Pain Assessment: No/denies pain    Home Living                          Prior Function            PT Goals (current goals can now be found in the care plan section) Acute Rehab PT Goals Patient Stated Goal: to get stronger and get back home PT Goal Formulation: With patient/family Time For Goal Achievement: 06/13/23 Potential to Achieve Goals: Good Progress towards PT goals: Progressing toward goals    Frequency    Min 1X/week      PT Plan  Co-evaluation              AM-PAC PT "6 Clicks" Mobility   Outcome Measure  Help needed turning from your back to your side while in a flat bed without using bedrails?: A Little Help needed moving from lying on your back to sitting on the side of a flat bed without using bedrails?: A Little Help needed moving to and from a bed to a chair (including a wheelchair)?: A Little Help needed standing up from a chair using your arms (e.g., wheelchair or bedside chair)?: A Little Help needed to walk in hospital room?: A Little Help needed climbing 3-5 steps with a railing? : A Lot 6 Click Score: 17    End of Session Equipment Utilized During Treatment:  Gait belt Activity Tolerance: Patient tolerated treatment well Patient left: in bed;with bed alarm set Nurse Communication: Mobility status PT Visit Diagnosis: Muscle weakness (generalized) (M62.81);Difficulty in walking, not elsewhere classified (R26.2)     Time: 1610-9604 PT Time Calculation (min) (ACUTE ONLY): 14 min  Charges:    $Gait Training: 8-22 mins PT General Charges $$ ACUTE PT VISIT: 1 Visit                     Elizabeth Palau, PT, DPT, GCS [phone]    Patsye Sullivant 06/01/2023, 1:36 PM

## 2023-06-01 NOTE — Progress Notes (Addendum)
 Blood sugar at approximately 1155 this am is 138; not transferring from glucometer

## 2023-06-01 NOTE — Care Management Important Message (Signed)
 Important Message  Patient Details  Name: Allison Rogers MRN: [id number] Date of Birth: 08-28-1934   Important Message Given:  Yes - Medicare IM     Cristela Blue, CMA 06/01/2023, 10:27 AM

## 2023-06-01 NOTE — Discharge Summary (Signed)
 Physician Discharge Summary  Allison Rogers ZOX:[id number] DOB: Nov 20, 1934 DOA: 05/29/2023  PCP: Leanna Sato, MD  Admit date: 05/29/2023 Discharge date: 06/01/2023  Admitted From: home  Disposition:  home w/ home health   Recommendations for Outpatient Follow-up:  Follow up with PCP in 1-2 weeks  Home Health: yes  Equipment/Devices:  Discharge Condition: stable  CODE STATUS: full  Diet recommendation: Heart Healthy / Carb Modified  Brief/Interim Summary: HPI was taken from Dr. Alvester Morin: Allison Rogers is a 89 y.o. female with medical history significant of hypertension, hyperlipidemia, hypothyroidism, type 2 diabetes presenting with influenza A with pneumonia.  History primarily from family in the setting of generalized lethargy.  Per report, patient recently diagnosed with influenza A roughly 1 week ago.  Was given course of Tamiflu.  Per the family, patient had mild improvement in symptoms still with progressively worsening symptoms over the past 3 to 4 days.  Positive malaise, decreased p.o. intake.  Worsening cough.  Positive nausea.  No reports of abdominal pain or diarrhea.  No focal hemiparesis or confusion.  Noted worsening weakness at home.  Also with worsening confusion over the past 24 to 48 hours. Presented to the ER afebrile, hemodynamically stable.  Satting well on room air.  White count 12.9, hemoglobin 13.5, platelets 217, troponin 40s.  Pro-Cal 0.24, influenza A positive.  Creatinine 1.32.  Glucose 154.  Potassium 3.1.CXR w/ RML PNA.   Discharge Diagnoses:  Principal Problem:   Influenza A with pneumonia Active Problems:   Essential hypertension   Type 2 diabetes mellitus (HCC)   Hypothyroidism   Dysphagia   Chest pain  Influenza A: completed tamiflu course. Continue w/ supportive care  Superimposed right middle lobe pneumonia: as per CXR. Continue on IV rocephin, azithromycin while inpatient and d/c home on po azithromycin to complete the course.    DM2: well  controlled, HbA1c 6.3. Continue on SSI w/ accuchecks    Hypothyroidism: continue on synthroid   Chest pain: pleuritic. Possibly secondary to pneumonia. Continue w/ supportive care    Dysphagia: continue on dysphagia III diet     Discharge Instructions  Discharge Instructions     Diet general   Complete by: As directed    Dysphagia III diet: Please CUT meats, add gravies. NO Salads. Likes Soups and may have baked and sweet potato w/ butter/sugar   Discharge instructions   Complete by: As directed    F/u w/ PCP in 1-2 weeks   Increase activity slowly   Complete by: As directed       Allergies as of 06/01/2023       Reactions   Lactose Intolerance (gi)    Penicillins Itching, Rash        Medication List     TAKE these medications    AeroChamber MV inhaler Use as instructed   albuterol 108 (90 Base) MCG/ACT inhaler Commonly known as: VENTOLIN HFA Inhale 1-2 puffs into the lungs every 4 (four) hours as needed for wheezing or shortness of breath.   aspirin EC 81 MG tablet Take 81 mg by mouth daily.   atorvastatin 40 MG tablet Commonly known as: LIPITOR Take 40 mg by mouth daily.   azithromycin 250 MG tablet Commonly known as: ZITHROMAX Take 1 tablet (250 mg total) by mouth daily for 2 days.   benzonatate 200 MG capsule Commonly known as: TESSALON Take 1 capsule (200 mg total) by mouth 3 (three) times daily as needed for cough.   Calcium 600/Vitamin D 600-400 MG-UNIT Tabs  Generic drug: Calcium Carb-Cholecalciferol Take 1 tablet by mouth 2 (two) times daily.   cholecalciferol 25 MCG (1000 UNIT) tablet Commonly known as: VITAMIN D3 Take 1,000 Units by mouth daily.   cyanocobalamin 1000 MCG tablet Take 1 tablet (1,000 mcg total) by mouth daily.   gabapentin 100 MG capsule Commonly known as: NEURONTIN Take 100 mg by mouth 3 (three) times daily.   glipiZIDE 10 MG 24 hr tablet Commonly known as: GLUCOTROL XL Take 10 mg by mouth 2 (two) times daily.    ibuprofen 800 MG tablet Commonly known as: ADVIL Take 800 mg by mouth 3 (three) times daily.   levothyroxine 50 MCG tablet Commonly known as: SYNTHROID Take 50 mcg by mouth daily.   loratadine 10 MG tablet Commonly known as: CLARITIN Take 10 mg by mouth daily.   magnesium oxide 400 MG tablet Commonly known as: MAG-OX Take 400 mg by mouth 2 (two) times daily.   omeprazole 20 MG capsule Commonly known as: PRILOSEC Take 20 mg by mouth daily.   tiZANidine 2 MG tablet Commonly known as: ZANAFLEX One tab po twice a day for one week then one tab po nightly for one week then stop   Tradjenta 5 MG Tabs tablet Generic drug: linagliptin Take 5 mg by mouth daily.   Voltaren 1 % Gel Generic drug: diclofenac Sodium Apply 2 g topically 2 (two) times daily as needed.        Allergies  Allergen Reactions   Lactose Intolerance (Gi)    Penicillins Itching and Rash    Consultations:    Procedures/Studies: DG Chest 2 View Result Date: 05/29/2023 CLINICAL DATA:  Shortness of breath.  Recent diagnosis of the flu. EXAM: CHEST - 2 VIEW COMPARISON:  09/01/2020 FINDINGS: Airspace disease in the right middle lobe. No edema, effusion, or pneumothorax. Normal heart size and mediastinal contours. IMPRESSION: Right middle lobe pneumonia. Followup PA and lateral chest X-ray is recommended in 3-4 weeks following trial of antibiotic therapy to ensure resolution. Electronically Signed   By: Tiburcio Pea M.D.   On: 05/29/2023 05:07   (Echo, Carotid, EGD, Colonoscopy, ERCP)    Subjective: Pt c/o fatigue    Discharge Exam: Vitals:   06/01/23 0857 06/01/23 1031  BP: (!) 155/63 (!) 154/65  Pulse: 71 65  Resp: 16 (!) 23  Temp: 98.7 F (37.1 C) 98.2 F (36.8 C)  SpO2: 98% 97%   Vitals:   05/31/23 1957 06/01/23 0349 06/01/23 0857 06/01/23 1031  BP: (!) 180/73 (!) 188/84 (!) 155/63 (!) 154/65  Pulse: 74 78 71 65  Resp: 18 18 16  (!) 23  Temp: 98.7 F (37.1 C) 98.8 F (37.1 C) 98.7 F  (37.1 C) 98.2 F (36.8 C)  TempSrc: Oral Oral Oral Oral  SpO2: 97% 96% 98% 97%  Weight:      Height:        General: Pt is alert, awake, not in acute distress Cardiovascular: S1/S2 +, no rubs, no gallops Respiratory: decreased breath sounds b/l  Abdominal: Soft, NT,obese, bowel sounds + Extremities: no edema, no cyanosis    The results of significant diagnostics from this hospitalization (including imaging, microbiology, ancillary and laboratory) are listed below for reference.     Microbiology: Recent Results (from the past 240 hours)  Resp panel by RT-PCR (RSV, Flu A&B, Covid) Anterior Nasal Swab     Status: Abnormal   Collection Time: 05/29/23  4:52 AM   Specimen: Anterior Nasal Swab  Result Value Ref Range Status  SARS Coronavirus 2 by RT PCR NEGATIVE NEGATIVE Final    Comment: (NOTE) SARS-CoV-2 target nucleic acids are NOT DETECTED.  The SARS-CoV-2 RNA is generally detectable in upper respiratory specimens during the acute phase of infection. The lowest concentration of SARS-CoV-2 viral copies this assay can detect is 138 copies/mL. A negative result does not preclude SARS-Cov-2 infection and should not be used as the sole basis for treatment or other patient management decisions. A negative result may occur with  improper specimen collection/handling, submission of specimen other than nasopharyngeal swab, presence of viral mutation(s) within the areas targeted by this assay, and inadequate number of viral copies(<138 copies/mL). A negative result must be combined with clinical observations, patient history, and epidemiological information. The expected result is Negative.  Fact Sheet for Patients:  BloggerCourse.com  Fact Sheet for Healthcare Providers:  SeriousBroker.it  This test is no t yet approved or cleared by the [place] FDA and  has been authorized for detection and/or diagnosis of SARS-CoV-2  by FDA under an Emergency Use Authorization (EUA). This EUA will remain  in effect (meaning this test can be used) for the duration of the COVID-19 declaration under Section 564(b)(1) of the Act, 21 U.S.C.section 360bbb-3(b)(1), unless the authorization is terminated  or revoked sooner.       Influenza A by PCR POSITIVE (A) NEGATIVE Final   Influenza B by PCR NEGATIVE NEGATIVE Final    Comment: (NOTE) The Xpert Xpress SARS-CoV-2/FLU/RSV plus assay is intended as an aid in the diagnosis of influenza from Nasopharyngeal swab specimens and should not be used as a sole basis for treatment. Nasal washings and aspirates are unacceptable for Xpert Xpress SARS-CoV-2/FLU/RSV testing.  Fact Sheet for Patients: BloggerCourse.com  Fact Sheet for Healthcare Providers: SeriousBroker.it  This test is not yet approved or cleared by the [place] FDA and has been authorized for detection and/or diagnosis of SARS-CoV-2 by FDA under an Emergency Use Authorization (EUA). This EUA will remain in effect (meaning this test can be used) for the duration of the COVID-19 declaration under Section 564(b)(1) of the Act, 21 U.S.C. section 360bbb-3(b)(1), unless the authorization is terminated or revoked.     Resp Syncytial Virus by PCR NEGATIVE NEGATIVE Final    Comment: (NOTE) Fact Sheet for Patients: BloggerCourse.com  Fact Sheet for Healthcare Providers: SeriousBroker.it  This test is not yet approved or cleared by the [place] FDA and has been authorized for detection and/or diagnosis of SARS-CoV-2 by FDA under an Emergency Use Authorization (EUA). This EUA will remain in effect (meaning this test can be used) for the duration of the COVID-19 declaration under Section 564(b)(1) of the Act, 21 U.S.C. section 360bbb-3(b)(1), unless the authorization is terminated or revoked.  Performed at  [facility], [address] [place], [place] [address]   Blood culture (routine x 2)     Status: None (Preliminary result)   Collection Time: 05/29/23  4:52 AM   Specimen: BLOOD  Result Value Ref Range Status   Specimen Description BLOOD LEFT ANTECUBITAL  Final   Special Requests   Final    BOTTLES DRAWN AEROBIC AND ANAEROBIC Blood Culture results may not be optimal due to an inadequate volume of blood received in culture bottles   Culture   Final    NO GROWTH 3 DAYS Performed at [facility], [address], [place], [place] [address]    Report Status PENDING  Incomplete  Blood culture (routine x 2)     Status: None (  Preliminary result)   Collection Time: 05/29/23  5:41 AM   Specimen: BLOOD  Result Value Ref Range Status   Specimen Description BLOOD BLOOD LEFT ARM  Final   Special Requests   Final    BOTTLES DRAWN AEROBIC AND ANAEROBIC Blood Culture results may not be optimal due to an inadequate volume of blood received in culture bottles   Culture   Final    NO GROWTH 3 DAYS Performed at [facility], [address] [place], [place] [address]    Report Status PENDING  Incomplete     Labs: BNP (last 3 results) No results for input(s): "BNP" in the last 8760 hours. Basic Metabolic Panel: Recent Labs  Lab 05/29/23 0411 05/30/23 0642 05/31/23 0541 06/01/23 0533  NA 137 137 139 139  K 3.1* 3.7 3.4* 3.6  CL 105 110 110 109  CO2 17* 21* 22 23  GLUCOSE 154* 177* 130* 127*  BUN 17 14 13 11   CREATININE 1.32* 0.95 0.93 0.88  CALCIUM 8.8* 8.5* 8.6* 8.6*  MG  --   --  2.1 1.8  PHOS  --  2.0* 3.0  --    Liver Function Tests: Recent Labs  Lab 05/29/23 0411 05/30/23 0642  AST 47* 40  ALT 28 32  ALKPHOS 63 56  BILITOT 2.0* 0.7  PROT 7.0 6.6  ALBUMIN 2.9* 2.5*   No results for input(s): "LIPASE", "AMYLASE" in the last 168 hours. No results for input(s): "AMMONIA" in the last 168 hours. CBC: Recent Labs  Lab 05/29/23 0411  05/30/23 0642  WBC 12.9* 11.4*  NEUTROABS 9.0*  --   HGB 13.5 11.0*  HCT 42.6 34.2*  MCV 92.2 91.7  PLT 217 289   Cardiac Enzymes: No results for input(s): "CKTOTAL", "CKMB", "CKMBINDEX", "TROPONINI" in the last 168 hours. BNP: Invalid input(s): "POCBNP" CBG: Recent Labs  Lab 05/31/23 1212 05/31/23 1711 05/31/23 2103 06/01/23 0902 06/01/23 1155  GLUCAP 133* 119* 153* 178* 138*   D-Dimer No results for input(s): "DDIMER" in the last 72 hours. Hgb A1c No results for input(s): "HGBA1C" in the last 72 hours. Lipid Profile No results for input(s): "CHOL", "HDL", "LDLCALC", "TRIG", "CHOLHDL", "LDLDIRECT" in the last 72 hours. Thyroid function studies No results for input(s): "TSH", "T4TOTAL", "T3FREE", "THYROIDAB" in the last 72 hours.  Invalid input(s): "FREET3" Anemia work up No results for input(s): "VITAMINB12", "FOLATE", "FERRITIN", "TIBC", "IRON", "RETICCTPCT" in the last 72 hours. Urinalysis    Component Value Date/Time   COLORURINE AMBER (A) 05/29/2023 2024   APPEARANCEUR CLEAR (A) 05/29/2023 2024   LABSPEC 1.026 05/29/2023 2024   PHURINE 6.0 05/29/2023 2024   GLUCOSEU NEGATIVE 05/29/2023 2024   HGBUR NEGATIVE 05/29/2023 2024   BILIRUBINUR NEGATIVE 05/29/2023 2024   KETONESUR NEGATIVE 05/29/2023 2024   PROTEINUR 100 (A) 05/29/2023 2024   NITRITE NEGATIVE 05/29/2023 2024   LEUKOCYTESUR NEGATIVE 05/29/2023 2024   Sepsis Labs Recent Labs  Lab 05/29/23 0411 05/30/23 0642  WBC 12.9* 11.4*   Microbiology Recent Results (from the past 240 hours)  Resp panel by RT-PCR (RSV, Flu A&B, Covid) Anterior Nasal Swab     Status: Abnormal   Collection Time: 05/29/23  4:52 AM   Specimen: Anterior Nasal Swab  Result Value Ref Range Status   SARS Coronavirus 2 by RT PCR NEGATIVE NEGATIVE Final    Comment: (NOTE) SARS-CoV-2 target nucleic acids are NOT DETECTED.  The SARS-CoV-2 RNA is generally detectable in upper respiratory specimens during the acute phase of  infection. The lowest  concentration of SARS-CoV-2 viral copies this assay can detect is 138 copies/mL. A negative result does not preclude SARS-Cov-2 infection and should not be used as the sole basis for treatment or other patient management decisions. A negative result may occur with  improper specimen collection/handling, submission of specimen other than nasopharyngeal swab, presence of viral mutation(s) within the areas targeted by this assay, and inadequate number of viral copies(<138 copies/mL). A negative result must be combined with clinical observations, patient history, and epidemiological information. The expected result is Negative.  Fact Sheet for Patients:  BloggerCourse.com  Fact Sheet for Healthcare Providers:  SeriousBroker.it  This test is no t yet approved or cleared by the [place] FDA and  has been authorized for detection and/or diagnosis of SARS-CoV-2 by FDA under an Emergency Use Authorization (EUA). This EUA will remain  in effect (meaning this test can be used) for the duration of the COVID-19 declaration under Section 564(b)(1) of the Act, 21 U.S.C.section 360bbb-3(b)(1), unless the authorization is terminated  or revoked sooner.       Influenza A by PCR POSITIVE (A) NEGATIVE Final   Influenza B by PCR NEGATIVE NEGATIVE Final    Comment: (NOTE) The Xpert Xpress SARS-CoV-2/FLU/RSV plus assay is intended as an aid in the diagnosis of influenza from Nasopharyngeal swab specimens and should not be used as a sole basis for treatment. Nasal washings and aspirates are unacceptable for Xpert Xpress SARS-CoV-2/FLU/RSV testing.  Fact Sheet for Patients: BloggerCourse.com  Fact Sheet for Healthcare Providers: SeriousBroker.it  This test is not yet approved or cleared by the [place] FDA and has been authorized for detection and/or diagnosis of  SARS-CoV-2 by FDA under an Emergency Use Authorization (EUA). This EUA will remain in effect (meaning this test can be used) for the duration of the COVID-19 declaration under Section 564(b)(1) of the Act, 21 U.S.C. section 360bbb-3(b)(1), unless the authorization is terminated or revoked.     Resp Syncytial Virus by PCR NEGATIVE NEGATIVE Final    Comment: (NOTE) Fact Sheet for Patients: BloggerCourse.com  Fact Sheet for Healthcare Providers: SeriousBroker.it  This test is not yet approved or cleared by the [place] FDA and has been authorized for detection and/or diagnosis of SARS-CoV-2 by FDA under an Emergency Use Authorization (EUA). This EUA will remain in effect (meaning this test can be used) for the duration of the COVID-19 declaration under Section 564(b)(1) of the Act, 21 U.S.C. section 360bbb-3(b)(1), unless the authorization is terminated or revoked.  Performed at [facility], [address] [place], [place] [address]   Blood culture (routine x 2)     Status: None (Preliminary result)   Collection Time: 05/29/23  4:52 AM   Specimen: BLOOD  Result Value Ref Range Status   Specimen Description BLOOD LEFT ANTECUBITAL  Final   Special Requests   Final    BOTTLES DRAWN AEROBIC AND ANAEROBIC Blood Culture results may not be optimal due to an inadequate volume of blood received in culture bottles   Culture   Final    NO GROWTH 3 DAYS Performed at [facility], [address], [place], [place] [address]    Report Status PENDING  Incomplete  Blood culture (routine x 2)     Status: None (Preliminary result)   Collection Time: 05/29/23  5:41 AM   Specimen: BLOOD  Result Value Ref Range Status   Specimen Description BLOOD BLOOD LEFT ARM  Final   Special Requests   Final    BOTTLES  DRAWN AEROBIC AND ANAEROBIC Blood Culture results may not be optimal due to an inadequate volume of blood received in  culture bottles   Culture   Final    NO GROWTH 3 DAYS Performed at [facility], [address] [place], [place] [address]    Report Status PENDING  Incomplete     Time coordinating discharge: Over 30 minutes  SIGNED:   Charise Killian, MD  [facility] 06/01/2023, 12:56 PM Pager   If 7PM-7AM, please contact night-coverage [email or web address]

## 2023-06-01 NOTE — Consult Note (Signed)
 PHARMACY CONSULT NOTE - ELECTROLYTES  Pharmacy Consult for Electrolyte Monitoring and Replacement   Recent Labs: Height: 5\' 5"  (165.1 cm) Weight: 97.5 kg (215 lb) IBW/kg (Calculated) : 57 Estimated Creatinine Clearance: 53 mL/min (by C-G formula based on SCr of 0.88 mg/dL).  Potassium (mmol/L)  Date Value  06/01/2023 3.6   Magnesium (mg/dL)  Date Value  40/98/1191 1.8   Calcium (mg/dL)  Date Value  47/82/9562 8.6 (L)   Albumin (g/dL)  Date Value  13/11/6576 2.5 (L)   Phosphorus (mg/dL)  Date Value  46/96/2952 3.0   Sodium (mmol/L)  Date Value  06/01/2023 139   Assessment  Allison Rogers is a 89 y.o. female presenting with SOB after having the flu 2 weeks ago. PMH significant for hypertension, diabetes, hyperlipidemia, hypothyroidism, and chronic kidney disease. Pharmacy has been consulted to monitor and replace electrolytes.  Diet: Dysphagia 3 MIVF: N/A Pertinent medications: None  Goal of Therapy: Electrolytes WNL  Plan:  Mg 1.8: Give magnesium sulfate 2 g IV x1 Check BMP, Mg, Phos with AM labs  Thank you for involving pharmacy in this patient's care.   Rockwell Alexandria, PharmD Clinical Pharmacist 06/01/2023 7:18 AM

## 2023-06-03 LAB — CULTURE, BLOOD (ROUTINE X 2)
Culture: NO GROWTH
Culture: NO GROWTH
